# Patient Record
Sex: Male | Born: 1960 | Race: White | Hispanic: No | Marital: Married
Health system: Midwestern US, Community
[De-identification: ages and names within clinical notes are randomized; demographics above are authoritative.]

## PROBLEM LIST (undated history)

## (undated) DIAGNOSIS — K219 Gastro-esophageal reflux disease without esophagitis: Secondary | ICD-10-CM

## (undated) DIAGNOSIS — I10 Essential (primary) hypertension: Secondary | ICD-10-CM

## (undated) DIAGNOSIS — K859 Acute pancreatitis without necrosis or infection, unspecified: Secondary | ICD-10-CM

## (undated) DIAGNOSIS — R739 Hyperglycemia, unspecified: Secondary | ICD-10-CM

## (undated) DIAGNOSIS — F5101 Primary insomnia: Secondary | ICD-10-CM

## (undated) DIAGNOSIS — J302 Other seasonal allergic rhinitis: Secondary | ICD-10-CM

## (undated) DIAGNOSIS — Z79899 Other long term (current) drug therapy: Secondary | ICD-10-CM

## (undated) DIAGNOSIS — F32A Depression, unspecified: Secondary | ICD-10-CM

## (undated) HISTORY — PX: NO PAST SURGERIES: SHX2092

## (undated) MED ORDER — PAROXETINE 20 MG TAB
20 mg | ORAL_TABLET | Freq: Every day | ORAL | Status: DC
Start: ? — End: 2014-03-30

## (undated) MED ORDER — TRAZODONE 50 MG TAB
50 mg | ORAL_TABLET | ORAL | Status: DC
Start: ? — End: 2014-08-16

## (undated) MED ORDER — CITALOPRAM 20 MG TAB
20 mg | ORAL_TABLET | ORAL | Status: DC
Start: ? — End: 2013-09-29

## (undated) MED ORDER — TRAZODONE 50 MG TAB
50 mg | ORAL_TABLET | Freq: Every evening | ORAL | Status: DC
Start: ? — End: 2013-10-04

## (undated) MED ORDER — CELECOXIB 100 MG CAP
100 mg | ORAL_CAPSULE | Freq: Two times a day (BID) | ORAL | Status: DC
Start: ? — End: 2013-10-03

## (undated) MED ORDER — CITALOPRAM 20 MG TAB
20 mg | ORAL_TABLET | Freq: Every day | ORAL | Status: DC
Start: ? — End: 2013-07-28

## (undated) MED ORDER — CYCLOBENZAPRINE 10 MG TAB
10 mg | ORAL_TABLET | Freq: Three times a day (TID) | ORAL | Status: DC | PRN
Start: ? — End: 2013-09-29

## (undated) MED ORDER — IBUPROFEN 800 MG TAB
800 mg | ORAL_TABLET | Freq: Four times a day (QID) | ORAL | Status: AC | PRN
Start: ? — End: 2012-07-20

## (undated) MED ORDER — HYDROCODONE-ACETAMINOPHEN 7.5 MG-325 MG TAB
ORAL_TABLET | Freq: Four times a day (QID) | ORAL | Status: DC | PRN
Start: ? — End: 2012-07-26

---

## 2012-05-10 NOTE — Progress Notes (Signed)
Ambulatory/Rehab Services H2 Model Falls Risk Assessment    Risk Factor Pts. ??   Confusion/Disorientation/Impulsivity  []    4 ??   Symptomatic Depression  []   2 ??   Altered Elimination  []   1 ??   Dizziness/Vertigo  []   1 ??   Gender (Male)  [x]   1 ??   Any administered antiepileptics (anticonvulsants):  []   2 ??   Any administered benzodiazepines:  []   1 ??   Visual Impairment (specify):  []   1 ??   Portable Oxygen Use  []   1 ??   Orthostatic ? BP  []   1 ??   History of Recent Falls (within 3 mos.)  []   5     Ability to Rise from Chair (choose one) Pts. ??   Ability to rise in a single movement  []   0 ??   Pushes up, successful in one attempt  []   1 ??   Multiple attempts, but successful  []   3 ??   Unable to rise without assistance  []   4   Total: (5 or greater = High Risk) 1     Falls Prevention Plan:   []                Physical Limitations to Exercise (specify):   []                Mobility Assistance Device (type):   []                Exercise/Equipment Adaptation (specify):    ??2010 [employer] All Rights Reserved. [place] Patent #7,[id number]. Federal Law prohibits the replication, distribution or use without written permission from [employer]

## 2012-05-10 NOTE — Progress Notes (Signed)
[facility] at [facility]   [address] [place], [place] [address]  Phone:[phone]   Fax:[phone]  Outpatient PHYSICAL THERAPY: Initial Assessment and Discharge  Fall Risk Score: 1 (? 5 = High Risk)  Treatment Diagnosis: Lumbago  REFERRING PHYSICIAN: File, Not On, MD  MD Orders: Evaluate and Treat  Return Physician Appointment: PRN  MEDICAL/REFERRING DIAGNOSIS: Low back pain [724.2]  Other physical therapy [V57.1]  DATE OF ONSET: 04/2012   PRIOR LEVEL OF FUNCTION: Independent  PRECAUTIONS/ALLERGIES: Per Chart  ASSESSMENT:  ????????This section established at most recent assessment??????????  PROBLEM LIST (Impairments causing functional limitations):  1. Decreased Strength affecting function  2. Decreased ADL/Functional Activities  3. Decreased Flexibility/joint mobility  4. Increased Pain affecting function  GOALS: (Goals have been discussed and agreed upon with patient.)  SHORT-TERM FUNCTIONAL GOALS: Time Frame: 1 visit  1. Instruct in a home exercise program.   GOAL ACHIEVED.  DISCHARGE GOALS: Time Frame: 1 visit  1.  Instruct in a home exercise program.  GOAL ACHIEVED.  REHABILITATION POTENTIAL FOR STATED GOALS: GoodPLAN OF CARE:  INTERVENTIONS PLANNED: (Benefits and precautions of physical therapy have been discussed with the patient.)  1. home exercise program (HEP  2. therapeutic exercise/strengthening/ROM exercises  TREATMENT PLAN EFFECTIVE DATES: 05/10/2012 TO 06/10/2012  FREQUENCY/DURATION: Follow patient 1 time a week for 1 week to address above goals.  Regarding [facility] therapy, I certify that the treatment plan above will be carried out by a therapist or under their direction.  Thank you for this referral,  Lilla Shook, PT         Referring Physician Signature: File, Not On, MD          Date                                                   SUBJECTIVE:  History of Present Injury/Illness (Reason for Referral): The patient reported a history of chronic  low back pain and DDD. He underwent a lumbar disectomy in 2009 and recent has been experiencing increased lumbar pain with prolonged flrxed postures. He realizes the need for a core strengthening program and was referred to physical therapy.  Present Symptoms: Increased lumbar pain, spasms.  Pain Intensity 1: 9  Dominant Side: right  Past Medical History: Lumbar disectomy  Current Medications: Per Chart   Date Last Reviewed: 05/10/2012  Social History/Home Situation: Independent  Work/Activity History: Medical products.  OBJECTIVE:  Outcome Measure:   Tool Used: Modified Oswestry Low Back Pain Questionnaire  Score:  Initial: 13/50  Most Recent: X/50 (Date: -- )   Interpretation of Score: Each section is scored on a 0-5 scale, 5 representing the greatest disability.  The scores of each section are added together for a total score of 50.    Score 0 1-10 11-20 21-30 31-40 41-49 50   Modifier CH CI CJ CK CL CM CN       Changing and Maintaining Body Position:    Z3086 - CURRENT STATUS: CJ - 20%-39% impaired, limited or restricted   V7846 - GOAL STATUS:  CJ - 20%-39% impaired, limited or restricted   N6295 - D/C STATUS:  ---------------To be determined---------------    Observation/Orthostatic Postural Assessment:   WNL.  Palpation:  Tenderness with palpation to the patient's lumbar spine.  ROM: TRUNK  AROM   Moderate limits in all trunk motions.                       Strength: LUMBAR SPINE   +3/5 for lumbar strength.              Special Tests: N/A  Neurological Screen: N/A  Functional Mobility: Independent   Balance:  Good.  TREATMENT:    (In addition to Assessment/Re-Assessment sessions the following treatments were rendered)  Therapeutic Exercise: ( 30):  The patient received treatment as below to improve mobility, strength and balance.  Required moderate verbal and manual cues to promote proper body alignment, promote proper body posture and promote proper body mechanics.  Progressed resistance, range and repetitions  as indicated.  The patient received exercise instruction followed by patient demonstration of the exercises to include single leg bridging, trunk rotations, abdominals in supine to include single hip and knee extension, and posterior pelvic tilts. Also trunk flexibility exercises to stabilize the lumbar spine and improved AROM to decrease pain and spasms allowing improved function.  Evaluation: ( 15 )  Manual Therapy (     ):   Therapeutic Modalities:                                                                                               HEP: As above; handouts given to patient for all exercises.  ______________________________________________________________________________________________________    Treatment Assessment:  The patient tolerated today's treatment without symptom exacerbation.  The patient demonstrated independence with the initial home exercises and is to perform the exercises independently and was DC from PT.  Recommendations/Intent for next treatment session: DC to an independent exercise program.  Total Treatment Duration:  PT Patient Time In/Time Out  Time In: 1015  Time Out: 1100    Lilla Shook, PT

## 2012-06-07 NOTE — Progress Notes (Signed)
[facility]rapy Center at [facility]   [address] [place], [place] [address]  [phone]   Fax:[phone]  Outpatient PHYSICAL THERAPY: Initial Assessment and Discharge  Fall Risk Score: 1 (? 5 = High Risk)  Treatment Diagnosis: Lumbago  REFERRING PHYSICIAN: File, Not On, MD  MD Orders: Evaluate and Treat  Return Physician Appointment: PRN  MEDICAL/REFERRING DIAGNOSIS: Low back pain [724.2]  Other physical therapy [V57.1]  DATE OF ONSET: 04/2012   PRIOR LEVEL OF FUNCTION: Independent  PRECAUTIONS/ALLERGIES: Per Chart  ASSESSMENT:  ????????This section established at most recent assessment??????????  PROBLEM LIST (Impairments causing functional limitations):  1. Decreased Strength affecting function  2. Decreased ADL/Functional Activities  3. Decreased Flexibility/joint mobility  4. Increased Pain affecting function  GOALS: (Goals have been discussed and agreed upon with patient.)  SHORT-TERM FUNCTIONAL GOALS: Time Frame: 1 visit  1. Instruct in a home exercise program and use of a neuromuscular stimulator.   GOAL ACHIEVED.  DISCHARGE GOALS: Time Frame: 1 visit  1.  Instruct in a home exercise program and use of a neuromuscular stimulator.  GOAL ACHIEVED.  REHABILITATION POTENTIAL FOR STATED GOALS: GoodPLAN OF CARE:  INTERVENTIONS PLANNED: (Benefits and precautions of physical therapy have been discussed with the patient.)  1. home exercise program (HEP  2. therapeutic exercise/strengthening/ROM exercises  TREATMENT PLAN EFFECTIVE DATES: 05/10/2012 TO 06/10/2012  FREQUENCY/DURATION: Follow patient 1 time a week for 1 week to address above goals.  Regarding [facility] therapy, I certify that the treatment plan above will be carried out by a therapist or under their direction.  Thank you for this referral,  Lilla Shook, PT         Referring Physician Signature: File, Not On, MD          Date                                                       SUBJECTIVE:  History of Present  Injury/Illness (Reason for Referral): The patient reported a history of chronic low back pain and DDD. He underwent a lumbar disectomy in 2009 and recent has been experiencing increased lumbar pain with prolonged flrxed postures. He realizes the need for a core strengthening program and was referred to physical therapy.  Present Symptoms: Increased lumbar pain, spasms.    The patient returned for portable neuromuscular stimulator as a result of muscle weakness and atrophy decreasing his lumbar stability and leading to increased his pain levels.  Pain Intensity 1: 9  Dominant Side: right  Past Medical History: Lumbar disectomy  Current Medications: Per Chart   Date Last Reviewed: 06/07/2012  Social History/Home Situation: Independent  Work/Activity History: Medical products.  OBJECTIVE:  Outcome Measure:   Tool Used: Modified Oswestry Low Back Pain Questionnaire  Score:  Initial: 13/50  Most Recent: X/50 (Date: -- )   Interpretation of Score: Each section is scored on a 0-5 scale, 5 representing the greatest disability.  The scores of each section are added together for a total score of 50.    Score 0 1-10 11-20 21-30 31-40 41-49 50   Modifier CH CI CJ CK CL CM CN       Changing and Maintaining Body Position:    M8413 - CURRENT STATUS: CJ - 20%-39% impaired, limited or restricted  N0272 - GOAL STATUS:  CJ - 20%-39% impaired, limited or restricted   Z3664 - D/C STATUS:  CJ - 20%-39% impaired, limited or restricted    Observation/Orthostatic Postural Assessment:   WNL.  Palpation:  Tenderness with palpation to the patient's lumbar spine.  ROM: TRUNK AROM   Moderate limits in all trunk motions.                       Strength: LUMBAR SPINE   +3/5 for lumbar strength.              Special Tests: N/A  Neurological Screen: N/A  Functional Mobility: Independent   Balance:  Good.  TREATMENT:    (In addition to Assessment/Re-Assessment sessions the following treatments were rendered)  Therapeutic Exercise: ( 15):  The patient  received treatment as below to improve mobility, strength and balance.  Required moderate verbal and manual cues to promote proper body alignment, promote proper body posture and promote proper body mechanics.  Progressed resistance, range and repetitions as indicated.  The patient received exercise instruction followed by patient demonstration of the exercises to include single leg bridging, trunk rotations, abdominals in supine to include single hip and knee extension, and posterior pelvic tilts. Also trunk flexibility exercises to stabilize the lumbar spine and improved AROM to decrease pain and spasms allowing improved function.   The patient was instructed in the use of his neuromuscular stimulator to use as a result of decreased strength and muscle atrophy effecting his lumbar spine.  Evaluation: (  )  Manual Therapy (     ):   Therapeutic Modalities:                                                                                               HEP: As above; handouts given to patient for all exercises.  ______________________________________________________________________________________________________    Treatment Assessment:  The patient tolerated today's treatment without symptom exacerbation.  The patient demonstrated independence with the initial home exercises and is to perform the exercises independently along with use of a neuromuscular stimulator and was DC from PT.  Recommendations/Intent for next treatment session: DC to an independent exercise program.  Total Treatment Duration:  PT Patient Time In/Time Out  Time In: 1000  Time Out: 1015    Lilla Shook, PT
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
## 2012-07-13 NOTE — ED Notes (Signed)
Manual signout performed
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
## 2012-07-13 NOTE — ED Notes (Signed)
I have reviewed discharge instructions with the patient.  The patient verbalized understanding.
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
## 2012-07-27 ENCOUNTER — Telehealth

## 2012-07-27 NOTE — Telephone Encounter (Signed)
ok 

## 2012-07-28 LAB — AMB POC TUBERCULOSIS, INTRADERMAL (SKIN TEST)

## 2012-07-28 NOTE — Progress Notes (Signed)
Ppd reading is negative.-N.S. ADAMS,CMA

## 2012-07-30 LAB — METABOLIC PANEL, BASIC
BUN: 12 mg/dl (ref 9–23)
CO2: 25 mmol/L (ref 20–32)
Calcium: 9.2 mg/dl (ref 8.4–10.5)
Chloride: 102 mmol/L (ref 97–111)
Creatinine: 1.1 mg/dL (ref 0.7–1.5)
GFR est AA: 88.7 mL/min (ref >60.00)
GFR est non-AA: 73.19 mL/min (ref >60.00)
Glucose: 89 mg/dl (ref 65–125)
Potassium: 4.1 mmol/L (ref 3.5–5.5)
Sodium: 137 mmol/L (ref 135–148)

## 2012-07-30 LAB — LIPID PANEL
CHOL/HDL Ratio: 3.8 (ref 0.0–6.7)
Cholesterol, total: 193 mg/dl (ref 0–200)
HDL Cholesterol: 50 mg/dl (ref >40)
LDL, calculated: 114 mg/dL (ref 0–130)
Triglyceride: 145 mg/dl (ref 0–200)
VLDL, calculated: 29 mg/dL (ref 0–40)

## 2012-07-30 LAB — CBC WITH AUTOMATED DIFF
ABS. GRANULOCYTES: 4.6 10*3/uL (ref 2.0–7.8)
ABS. LYMPHOCYTES: 1.9 10*3/uL — ABNORMAL LOW (ref 2.0–7.8)
ABS. MONOCYTES: 0.4 (ref 0.10–1.08)
GRANULOCYTES: 66 % (ref 37.0–92.0)
HCT: 42.2 % (ref 41.0–54.0)
HGB: 14.3 g/dL (ref 14.0–18.0)
LYMPHOCYTES: 27.6 % (ref 20.5–51.1)
MCH: 31.2 pg (ref 27.0–34.0)
MCHC: 34 g/dL (ref 31.0–36.0)
MCV: 92 fL (ref 80.0–100.0)
MEAN PLATELET VOLUME: 8.5 fL
MONOCYTES: 6.4 (ref 3.0–10.0)
PLATELET: 212 10*3/uL (ref 140–440)
RBC: 4.59 10*3/uL (ref 4.40–6.20)
RDW: 13 %
WBC: 6.9 10*3/uL (ref 4.0–11.0)

## 2012-08-02 LAB — PSA, DIAGNOSTIC (PROSTATE SPECIFIC AG): PSA: 0.4 ng/ml (ref 0.0–4.0)

## 2012-08-02 NOTE — Telephone Encounter (Signed)
Labs looked good.  Cholesterol was up just a touch, but not enough to need medicine.  He needs a copy of our diet handout.

## 2012-08-04 NOTE — Telephone Encounter (Signed)
08/04/2012  Labs looked good. Cholesterol was up just a touch, but not enough to need medicine. He needs a copy of our diet handout.-N.S. ADAMS,CMA

## 2013-03-22 LAB — CBC WITH AUTOMATED DIFF
ABS. GRANULOCYTES: 4.2 10*3/uL (ref 2.0–7.8)
ABS. LYMPHOCYTES: 1.7 10*3/uL — ABNORMAL LOW (ref 2.0–7.8)
ABS. MONOCYTES: 0.5 (ref 0.10–1.08)
GRANULOCYTES: 65.1 % (ref 37.0–92.0)
HCT: 39.7 % — ABNORMAL LOW (ref 41.0–54.0)
HGB: 14 g/dL (ref 14.0–18.0)
LYMPHOCYTES: 26.5 % (ref 20.5–51.1)
MCH: 33.1 pg (ref 27.0–34.0)
MCHC: 35.3 g/dL (ref 31.0–36.0)
MCV: 93.8 fL (ref 80.0–100.0)
MEAN PLATELET VOLUME: 8 fL
MONOCYTES: 8.4 (ref 3.0–10.0)
PLATELET: 199 10*3/uL (ref 140–440)
RBC: 4.23 10*3/uL — ABNORMAL LOW (ref 4.40–6.20)
RDW: 12.7 %
WBC: 6.4 10*3/uL (ref 4.0–11.0)

## 2013-03-22 LAB — HEPATIC FUNCTION PANEL
ALT (SGPT): 22 IU/L (ref 10–35)
AST (SGOT): 29 IU/L (ref 16–40)
Albumin: 4.2 g/dl (ref 3.2–5.0)
Alk. phosphatase: 69 U/L (ref 31–130)
Bilirubin, direct: 0.1 mg/dl (ref 0.0–0.4)
Bilirubin, total: 0.7 mg/dl (ref 0.4–1.4)
Protein, total: 6.8 g/dl (ref 6.0–8.5)

## 2013-03-22 LAB — LIPID PANEL
CHOL/HDL Ratio: 3.7 (ref 0.0–6.7)
Cholesterol, total: 201 mg/dl — ABNORMAL HIGH (ref 0–200)
HDL Cholesterol: 55 mg/dl (ref >40)
LDL, calculated: 136 mg/dL — ABNORMAL HIGH (ref 0–130)
Triglyceride: 51 mg/dl (ref 0–200)
VLDL, calculated: 10 mg/dL (ref 0–40)

## 2013-03-22 LAB — METABOLIC PANEL, BASIC
BUN: 14 mg/dl (ref 9–23)
CO2: 29 mmol/L (ref 20–32)
Calcium: 9.4 mg/dl (ref 8.4–10.5)
Chloride: 101 mmol/L (ref 97–111)
Creatinine: 1 mg/dL (ref 0.7–1.5)
GFR est AA: 100.84 mL/min (ref >60.00)
GFR est non-AA: 83.2 mL/min (ref >60.00)
Glucose: 89 mg/dl (ref 65–100)
Potassium: 4.3 mmol/L (ref 3.5–5.5)
Sodium: 138 mmol/L (ref 135–148)

## 2013-03-22 NOTE — Progress Notes (Addendum)
Dustin Noble, M.D.  Internal Medicine  [facility]  [address] [place], [place] [address]  Phone: [phone] Fax: [phone]    HISTORY OF PRESENT ILLNESS  Dustin Noble is a 52 y.o. male.  HPI    Dustin Noble is a Caucasian male who first came to me 07/2012.    1. HLD (272.4): Controlled with TLCs.   a. Context:   i. ATP Risk Factors =   ii. Framingham =   iii. Goal LDL =   b. FLPs:   i. 07/30/12 Untreated = [193/114/50/145].   ii. 03/22/13 Untreated = [201/136/55/51].   2. Chronic Low Back Pain (724.2): Followed with Pain Management in [place].  Now follows with Ortho down here.   3. Anxiety (300.00): 07/26/12 started on Celexa which he's taking without problems and with good control of symptoms.   4. Insomnia (780.52): 03/22/13 started on Trazodone.   5. HCM:     Colonoscopy: 09/13/08 in [place] = Tics; No polyps; 7-10 Year Repeat Rec.    Prostate:   o PSA's:  1. 07/30/12 = 0.4.    Pneumovax:     Td:     Flu: 07/26/12.    Current Outpatient Prescriptions   Medication Sig Dispense Refill   ??? tiZANidine (ZANAFLEX) 4 mg tablet Take 4 mg by mouth two (2) times daily as needed.       ??? ibuprofen (MOTRIN) 800 mg tablet Take  by mouth every six (6) hours as needed for Pain.       ??? citalopram (CELEXA) 20 mg tablet Take 1 Tab by mouth daily.  90 Tab  1   ??? cyclobenzaprine (FLEXERIL) 10 mg tablet Take 1 Tab by mouth three (3) times daily as needed for Muscle Spasm(s).  20 Tab  0     No Known Allergies  Past Medical History   Diagnosis Date   ??? Chronic low back pain    ??? Anxiety      History reviewed. No pertinent past surgical history.  History     Social History   ??? Marital Status: MARRIED     Spouse Name: N/A     Number of Children: N/A   ??? Years of Education: N/A     Occupational History   ??? Sells vascular stents.       Social History Main Topics   ??? Smoking status: Never Smoker    ??? Smokeless tobacco: Not on file   ??? Alcohol Use: No   ??? Drug Use: No   ??? Sexually Active: Not on file     Other Topics Concern   ??? Not  on file     Social History Narrative   ??? No narrative on file     Family History   Problem Relation Age of Onset   ??? Diabetes Brother      Dm1   ??? Cancer Neg Hx    ??? Heart Disease Neg Hx    ??? Heart Attack Neg Hx      Review of Systems   Constitutional: Negative for fever, chills and weight loss.   Respiratory: Negative for cough, shortness of breath and wheezing.    Cardiovascular: Negative for chest pain, palpitations and leg swelling.   Gastrointestinal: Negative for nausea, vomiting, abdominal pain, diarrhea, constipation, blood in stool and melena.   Neurological: Negative for dizziness, sensory change, focal weakness, seizures and headaches.     Physical Exam   Nursing note and vitals reviewed.  Constitutional:  He is oriented to person, place, and time. He appears well-developed and well-nourished. No distress.   HENT:   Head: Normocephalic and atraumatic.   Neck: Normal range of motion. Neck supple. No JVD present. No tracheal deviation present. No thyromegaly present.   Cardiovascular: Normal rate, regular rhythm, normal heart sounds and intact distal pulses.  Exam reveals no gallop and no friction rub.    No murmur heard.  Pulmonary/Chest: Effort normal and breath sounds normal. No stridor. No respiratory distress. He has no wheezes. He has no rales. He exhibits no tenderness.   Abdominal: Soft. Bowel sounds are normal. He exhibits no distension and no mass. There is no tenderness. There is no rebound and no guarding.   Musculoskeletal: He exhibits no edema.   Lymphadenopathy:     He has no cervical adenopathy.   Neurological: He is alert and oriented to person, place, and time.   Skin: Skin is warm and dry. No rash noted. He is not diaphoretic. No erythema. No pallor.   Psychiatric: He has a normal mood and affect. His behavior is normal. Judgment and thought content normal.     ASSESSMENT and PLAN    1. HLD (272.4): Well controlled off medications.  Continue TLCs.   2. Chronic Low Back Pain (724.2): Follow  up with Ortho.  3. Anxiety (300.00): Well controlled.  Continue current regimen.   4. Insomnia (780.52): Start Trazodone.  5. HCM:   6. F/u: 6 Months.  No planned labs at that visit.    [name] was seen today for anxiety.    Diagnoses and associated orders for this visit:    HLD (hyperlipidemia)  - BASIC METABOLIC PANEL (46962)  - CBC (95284)  - LIPID PANEL (13244)  - LIVER PANEL 1 (01027)    Anxiety    Chronic low back pain    Insomnia  - traZODone (DESYREL) 50 mg tablet; Take 1 Tab by mouth nightly.    Encounter for long-term (current) use of other medications  - BASIC METABOLIC PANEL (25366)  - CBC (44034)  - LIVER PANEL 1 (74259)

## 2013-09-29 NOTE — Progress Notes (Signed)
Dustin MillardJohn E. Anuel Noble, M.D.  Internal Medicine  [facility]  [address] [place], [place] [address]  Phone: [phone] Fax: [phone]    HISTORY OF PRESENT ILLNESS  Dustin BunchChristopher A Noble is a 53 y.o. male.  HPI    Dustin Noble is a Caucasian male who first came to me 07/2012.     1. HLD (272.4): Controlled with TLCs.   a. Context:   i. ATP Risk Factors =   ii. Framingham =   iii. Goal LDL =   b. FLPs:   i. 07/30/12 Untreated = [193/114/50/145].   ii. 03/22/13 Untreated = [201/136/55/51].   2. Chronic Low Back Pain (724.2): Followed with Pain Management in [place].  Now follows with Charletta CousinSteadman Hawkins down here.   3. Anxiety (300.00): 07/26/12 started on Celexa which he's taking without problems and without good control of symptoms.  09/29/13 started on Paxil.   4. Insomnia (780.52): 03/22/13 started on Trazodone which he's taking without problems and with good control of symptoms.   5. HCM:     Colonoscopy: 09/13/08 in [place] = Tics; No polyps; 7-10 Year Repeat Rec.    Prostate:   o PSA's:  1. 07/30/12 = 0.4.    Pneumovax:     Td:     Flu: Elsewhere 2014.    Current Outpatient Prescriptions   Medication Sig Dispense Refill   ??? oxyCODONE IR (ROXICODONE) 5 mg immediate release tablet Take 5 mg by mouth every four (4) hours as needed for Pain.       ??? citalopram (CELEXA) 20 mg tablet TAKE 1 TABLET BY MOUTH EVERY DAY  90 tablet  1   ??? traZODone (DESYREL) 50 mg tablet Take 1 Tab by mouth nightly.  90 Tab  1     No Known Allergies  Past Medical History   Diagnosis Date   ??? Chronic low back pain    ??? Anxiety      History reviewed. No pertinent past surgical history.  History     Social History   ??? Marital Status: MARRIED     Spouse Name: N/A     Number of Children: N/A   ??? Years of Education: N/A     Occupational History   ??? Sells vascular stents.       Social History Main Topics   ??? Smoking status: Never Smoker    ??? Smokeless tobacco: Not on file   ??? Alcohol Use: No   ??? Drug Use: No   ??? Sexually Active: Not on file     Other Topics  Concern   ??? Not on file     Social History Narrative   ??? No narrative on file     Family History   Problem Relation Age of Onset   ??? Diabetes Brother      Dm1   ??? Cancer Neg Hx    ??? Heart Disease Neg Hx    ??? Heart Attack Neg Hx      Review of Systems   Constitutional: Negative for fever, chills and weight loss.   Respiratory: Negative for cough, shortness of breath and wheezing.    Cardiovascular: Negative for chest pain, palpitations and leg swelling.   Gastrointestinal: Negative for nausea, vomiting, abdominal pain, diarrhea, constipation, blood in stool and melena.   Neurological: Negative for dizziness, sensory change, focal weakness, seizures and headaches.   Psychiatric/Behavioral: Negative for depression, suicidal ideas, hallucinations and substance abuse. The patient is nervous/anxious.      Physical  Exam   Nursing note and vitals reviewed.  Constitutional: He is oriented to person, place, and time. He appears well-developed and well-nourished. No distress.   HENT:   Head: Normocephalic and atraumatic.   Neck: Normal range of motion. Neck supple. No JVD present. No tracheal deviation present. No thyromegaly present.   Cardiovascular: Normal rate, regular rhythm, normal heart sounds and intact distal pulses.  Exam reveals no gallop and no friction rub.    No murmur heard.  Pulmonary/Chest: Effort normal and breath sounds normal. No stridor. No respiratory distress. He has no wheezes. He has no rales. He exhibits no tenderness.   Abdominal: Soft. Bowel sounds are normal. He exhibits no distension and no mass. There is no tenderness. There is no rebound and no guarding.   Musculoskeletal: He exhibits no edema.   Lymphadenopathy:     He has no cervical adenopathy.   Neurological: He is alert and oriented to person, place, and time.   Skin: Skin is warm and dry. No rash noted. He is not diaphoretic. No erythema. No pallor.   Psychiatric: He has a normal mood and affect. His behavior is normal. Judgment and  thought content normal.     ASSESSMENT and PLAN    1. HLD (272.4): Well controlled off medications.  Continue TLCs.   2. Chronic Low Back Pain (724.2): Follow up with Ortho.  3. Anxiety (300.00): Not well controlled.  Switch to Paxil.   4. Insomnia (780.52): Well controlled.  Continue current regimen.   5. HCM:   6. F/u: 6 Months.  Fasting labs at that visit.    Dustin Noble was seen today for anxiety.    Diagnoses and associated orders for this visit:    HLD (hyperlipidemia)    Chronic low back pain    Insomnia    Anxiety  - PARoxetine (PAXIL) 20 mg tablet; Take 1 tablet by mouth daily.    Other Orders  - oxyCODONE IR (ROXICODONE) 5 mg immediate release tablet; Take 5 mg by mouth every four (4) hours as needed for Pain.

## 2013-10-03 NOTE — Telephone Encounter (Signed)
Legally I can't give him narcotics for a problem that I'm not treating him for.  I could send him in some anti-inflammatories to tide him over until his pain management doctor is back in.

## 2013-10-03 NOTE — Telephone Encounter (Signed)
Pt notified that Legally Dr. Westly PamLacy can't give him narcotics for a problem that he is not treating him for. Dr. Westly PamLacy could send him in some anti-inflammatories to tide him over until his pain management doctor is back in. Pt declined and would contact the doctor tomorrow about his medication. celebrex was not called to the pharmacy

## 2013-10-03 NOTE — Telephone Encounter (Signed)
Pt was given pain medication from his orthopaedic, in which his doctor is closed for the holiday. Pt was told by his ortho doctor on call, to contact you and ask if you will fill this medication. Pt was explained that we can not refill controlled substance that was ordered from another doctor.

## 2014-03-30 LAB — CBC WITH AUTOMATED DIFF
ABS. GRANULOCYTES: 4.1 10*3/uL (ref 2.0–7.8)
ABS. LYMPHOCYTES: 2.4 10*3/uL (ref 2.0–7.8)
ABS. MONOCYTES: 0.5 (ref 0.10–1.08)
GRANULOCYTES: 59.1 % (ref 37.0–92.0)
HCT: 43 % (ref 41.0–54.0)
HGB: 14.4 g/dL (ref 14.0–18.0)
LYMPHOCYTES: 34.4 % (ref 20.5–51.1)
MCH: 31.2 pg (ref 27.0–34.0)
MCHC: 33.6 g/dL (ref 31.0–36.0)
MCV: 92.8 fL (ref 80.0–100.0)
MEAN PLATELET VOLUME: 9.9 fL
MONOCYTES: 6.5 (ref 3.0–10.0)
PLATELET: 201 10*3/uL (ref 140–440)
RBC: 4.63 10*3/uL (ref 4.40–6.20)
RDW: 13 %
WBC: 7 10*3/uL (ref 4.0–11.0)

## 2014-03-30 LAB — HEPATIC FUNCTION PANEL
ALT (SGPT): 26 IU/L (ref 10–35)
AST (SGOT): 40 IU/L (ref 16–40)
Albumin: 4.4 g/dl (ref 3.2–5.0)
Alk. phosphatase: 65 U/L (ref 31–130)
Bilirubin, direct: 0.1 mg/dl (ref 0.0–0.4)
Bilirubin, total: 0.9 mg/dl (ref 0.4–1.4)
Protein, total: 6.7 g/dl (ref 6.0–8.5)

## 2014-03-30 LAB — METABOLIC PANEL, BASIC
BUN: 20 mg/dl (ref 9–23)
CO2: 27 mmol/L (ref 20–32)
Calcium: 9.5 mg/dl (ref 8.4–10.5)
Chloride: 99 mmol/L (ref 97–111)
Creatinine: 1 mg/dL (ref 0.7–1.5)
GFR est AA: 106.58 mL/min (ref >60.00)
GFR est non-AA: 87.93 mL/min (ref >60.00)
Glucose: 76 mg/dl (ref 65–100)
Potassium: 4.2 mmol/L (ref 3.5–5.5)
Sodium: 136 mmol/L (ref 135–148)

## 2014-03-30 LAB — LIPID PANEL
CHOL/HDL Ratio: 3.7 (ref 0.0–6.7)
Cholesterol, total: 181 mg/dl (ref 0–200)
HDL Cholesterol: 49 mg/dl (ref >40)
LDL, calculated: 122 mg/dL (ref 0–130)
Triglyceride: 50 mg/dl (ref 0–200)
VLDL, calculated: 10 mg/dL (ref 0–40)

## 2014-03-30 LAB — PSA, DIAGNOSTIC (PROSTATE SPECIFIC AG): PSA: 0.3 ng/ml (ref 0.0–4.0)

## 2014-03-30 NOTE — Progress Notes (Addendum)
Lona MillardJohn E. Jamin Humphries, M.D.  Internal Medicine  [facility]  [address] [place], [place] [address]  Phone: [phone] Fax: [phone]    HISTORY OF PRESENT ILLNESS  Eudelia BunchChristopher A Hillmer is a 53 y.o. male.  HPI    [name] is a Caucasian male who first came to me 07/2012.     1. HLD (272.4): Controlled with TLCs.   a. Context:   i. ATP Risk Factors =   ii. Framingham =   iii. Goal LDL =   b. FLPs:   i. 07/30/12 Untreated = [193/114/50/145].   ii. 03/22/13 Untreated = [201/136/55/51].   iii. 03/30/14 Untreated = [181/122/49/50].   2. Insomnia (780.52): 03/22/13 started on Trazodone which he's taking without problems and with good control of symptoms.   3. Chronic Back Pain (724.5): Followed with Pain Management in [place].  Now follows with Charletta CousinSteadman Hawkins.   4. HCM:   ? Colonoscopy: 09/13/08 in [place] = Tics; No polyps; 7-10 Year Repeat Rec.  ? Prostate:   o PSA's:  1. 07/30/12 = 0.4.  2. 03/30/14 = 0.3.   ? Pneumovax:   ? Td:   ? Flu: Elsewhere 2014.    Current Outpatient Prescriptions   Medication Sig Dispense Refill   ??? traZODone (DESYREL) 50 mg tablet TAKE 1 TABLET BY MOUTH NIGHTLY 90 tablet 1   ??? oxyCODONE IR (ROXICODONE) 5 mg immediate release tablet Take 5 mg by mouth every four (4) hours as needed for Pain.     ??? PARoxetine (PAXIL) 20 mg tablet Take 1 tablet by mouth daily. 90 tablet 1     No Known Allergies  Past Medical History   Diagnosis Date   ??? Chronic low back pain    ??? Anxiety    ??? Insomnia 09/29/2013     History reviewed. No pertinent past surgical history.  History     Social History   ??? Marital Status: MARRIED     Spouse Name: N/A     Number of Children: N/A   ??? Years of Education: N/A     Occupational History   ??? Sells vascular stents.       Social History Main Topics   ??? Smoking status: Never Smoker    ??? Smokeless tobacco: Not on file   ??? Alcohol Use: No   ??? Drug Use: No   ??? Sexual Activity: Not on file     Other Topics Concern   ??? Not on file     Social History Narrative     Family History    Problem Relation Age of Onset   ??? Diabetes Brother      Dm1   ??? Cancer Neg Hx    ??? Heart Disease Neg Hx    ??? Heart Attack Neg Hx      Review of Systems   Respiratory: Negative for cough, sputum production, shortness of breath and wheezing.    Cardiovascular: Negative for chest pain, palpitations, claudication and leg swelling.   Gastrointestinal: Negative for nausea, vomiting, abdominal pain, blood in stool and melena.     Physical Exam   Constitutional: He is oriented to person, place, and time. He appears well-developed and well-nourished. No distress.   HENT:   Head: Normocephalic and atraumatic.   Neck: Normal range of motion. Neck supple. No JVD present. No tracheal deviation present. No thyromegaly present.   Cardiovascular: Normal rate, regular rhythm, normal heart sounds and intact distal pulses.  Exam reveals no gallop and no  friction rub.    No murmur heard.  Pulmonary/Chest: Effort normal and breath sounds normal. No stridor. No respiratory distress. He has no wheezes. He has no rales. He exhibits no tenderness.   Abdominal: Soft. Bowel sounds are normal. He exhibits no distension and no mass. There is no tenderness. There is no rebound and no guarding.   Musculoskeletal: He exhibits no edema.   Lymphadenopathy:     He has no cervical adenopathy.   Neurological: He is alert and oriented to person, place, and time.   Skin: Skin is warm and dry. No rash noted. He is not diaphoretic. No erythema. No pallor.   Psychiatric: He has a normal mood and affect. His behavior is normal. Judgment and thought content normal.   Nursing note and vitals reviewed.    ASSESSMENT and PLAN    1. HLD (272.4): Well controlled off medications.  Continue TLCs.   2. Insomnia (780.52): Well controlled.  Continue current regimen.   3. Chronic Low Back Pain (724.5): Follow up with Ortho.  4. HCM:   5. F/u: 6 Months.  No planned labs at that visit.    Dezman was seen today for cholesterol problem.     Diagnoses and associated orders for this visit:    HLD (hyperlipidemia)  - BMP  - CBC  - Lipid Panel  - Liver Panel    Chronic back pain    Insomnia    Anxiety    Prostate cancer screening  - PSA

## 2014-08-17 MED ORDER — TRAZODONE 50 MG TAB
50 mg | ORAL_TABLET | ORAL | Status: DC
Start: 2014-08-17 — End: 2015-04-04

## 2014-08-21 ENCOUNTER — Institutional Professional Consult (permissible substitution)
Admit: 2014-08-21 | Discharge: 2014-08-21 | Payer: BLUE CROSS/BLUE SHIELD | Attending: Internal Medicine | Primary: Internal Medicine

## 2014-08-21 DIAGNOSIS — Z111 Encounter for screening for respiratory tuberculosis: Secondary | ICD-10-CM

## 2014-08-21 NOTE — Progress Notes (Signed)
PPD placed in Left forearm

## 2014-08-23 ENCOUNTER — Encounter: Attending: Internal Medicine | Primary: Internal Medicine

## 2014-08-24 ENCOUNTER — Ambulatory Visit
Admit: 2014-08-24 | Discharge: 2014-08-24 | Payer: BLUE CROSS/BLUE SHIELD | Attending: Internal Medicine | Primary: Internal Medicine

## 2014-08-24 DIAGNOSIS — Z111 Encounter for screening for respiratory tuberculosis: Secondary | ICD-10-CM

## 2014-08-24 LAB — AMB POC TUBERCULOSIS, INTRADERMAL (SKIN TEST)
PPD: 0 Negative
mm Induration: 0 mm

## 2014-08-24 NOTE — Progress Notes (Signed)
PPD reading was negative

## 2014-10-04 ENCOUNTER — Ambulatory Visit
Admit: 2014-10-04 | Discharge: 2014-10-04 | Payer: BLUE CROSS/BLUE SHIELD | Attending: Internal Medicine | Primary: Internal Medicine

## 2014-10-04 DIAGNOSIS — E785 Hyperlipidemia, unspecified: Secondary | ICD-10-CM

## 2014-10-04 NOTE — Progress Notes (Signed)
Dustin MillardJohn E. Adit Noble, M.D.  Internal Medicine  [facility]  [address] [place], [place] [address]  Phone: [phone] Fax: [phone]    HISTORY OF PRESENT ILLNESS  Dustin BunchChristopher A Noble is a 54 y.o. male.  Cholesterol Problem  The history is provided by the patient. This is a chronic problem. The current episode started more than 1 week ago. The problem occurs constantly. The problem has not changed since onset.Pertinent negatives include no chest pain, no abdominal pain and no shortness of breath. The symptoms are aggravated by eating. Relieved by: TLCs. Treatments tried: TLCs. The treatment provided moderate relief.     Dustin Noble is a Caucasian male who first came to me 07/2012.     1. HLD (272.4): Controlled with TLCs.   a. Context:   i. ATP Risk Factors =   ii. Framingham =   iii. Goal LDL =   b. FLPs:   i. 07/30/12 Untreated = [193/114/50/145].   ii. 03/22/13 Untreated = [201/136/55/51].   iii. 03/30/14 Untreated = [181/122/49/50].   2. Insomnia (780.52): 03/22/13 started on Trazodone which he's taking without problems and with good control of symptoms.   3. Chronic Back Pain: On Suboxone from Dr. Diamond NickelSherbondy.   4. HCM:   ? Colonoscopy: 09/13/08 in [place] = Tics; No polyps; 7-10 Year Repeat Rec.  He prefers 10 years, but might consider next year.   ? Prostate:   o PSA's:  1. 07/30/12 = 0.4.  2. 03/30/14 = 0.3.   ? Pneumovax:   ? Td:   ? Flu: Elsewhere 2015.    Current Outpatient Prescriptions   Medication Sig Dispense Refill   ??? traZODone (DESYREL) 50 mg tablet TAKE 1 TABLET BY MOUTH NIGHTLY 90 Tab 1     No Known Allergies  Past Medical History   Diagnosis Date   ??? Chronic low back pain    ??? Anxiety    ??? Insomnia 09/29/2013     No past surgical history on file.  History     Social History   ??? Marital Status: MARRIED     Spouse Name: N/A     Number of Children: N/A   ??? Years of Education: N/A     Occupational History   ??? Sells vascular stents.       Social History Main Topics   ??? Smoking status: Never Smoker     ??? Smokeless tobacco: Not on file   ??? Alcohol Use: No   ??? Drug Use: No   ??? Sexual Activity: Not on file     Other Topics Concern   ??? Not on file     Social History Narrative     Family History   Problem Relation Age of Onset   ??? Diabetes Brother      Dm1   ??? Cancer Neg Hx    ??? Heart Disease Neg Hx    ??? Heart Attack Neg Hx      Review of Systems   Respiratory: Negative for cough, sputum production, shortness of breath and wheezing.    Cardiovascular: Negative for chest pain, palpitations, claudication and leg swelling.   Gastrointestinal: Negative for nausea, vomiting, abdominal pain, blood in stool and melena.     Physical Exam   Constitutional: He is oriented to person, place, and time. He appears well-developed and well-nourished. No distress.   HENT:   Head: Normocephalic and atraumatic.   Neck: Normal range of motion. Neck supple. No JVD present. No tracheal deviation present.  No thyromegaly present.   Cardiovascular: Normal rate, regular rhythm, normal heart sounds and intact distal pulses.  Exam reveals no gallop and no friction rub.    No murmur heard.  Pulmonary/Chest: Effort normal and breath sounds normal. No stridor. No respiratory distress. He has no wheezes. He has no rales. He exhibits no tenderness.   Abdominal: Soft. Bowel sounds are normal. He exhibits no distension and no mass. There is no tenderness. There is no rebound and no guarding.   Musculoskeletal: He exhibits no edema.   Lymphadenopathy:     He has no cervical adenopathy.   Neurological: He is alert and oriented to person, place, and time.   Skin: Skin is warm and dry. No rash noted. He is not diaphoretic. No erythema. No pallor.   Psychiatric: He has a normal mood and affect. His behavior is normal. Judgment and thought content normal.   Nursing note and vitals reviewed.    ASSESSMENT and PLAN    1. HLD (272.4): Well controlled off medications.  Continue TLCs.   2. Insomnia (780.52): Well controlled.  Continue current regimen.    3. Chronic Low Back Pain (724.5): Follow up with Dr. Diamond Nickel.  4. HCM:   5. F/u: 6 Months.  No planned labs at that visit.    [name] was seen today for cholesterol problem.    Diagnoses and all orders for this visit:    HLD (hyperlipidemia)    Insomnia

## 2015-04-04 ENCOUNTER — Ambulatory Visit
Admit: 2015-04-04 | Discharge: 2015-04-04 | Payer: BLUE CROSS/BLUE SHIELD | Attending: Internal Medicine | Primary: Internal Medicine

## 2015-04-04 DIAGNOSIS — E782 Mixed hyperlipidemia: Secondary | ICD-10-CM

## 2015-04-04 LAB — CBC WITH AUTOMATED DIFF
ABS. GRANULOCYTES: 4.1 10*3/uL (ref 2.0–7.8)
ABS. LYMPHOCYTES: 2.1 10*3/uL (ref 2.0–7.8)
ABS. MONOCYTES: 0.3 10*3/uL (ref 0.10–1.08)
GRANULOCYTES: 63.3 % (ref 37.0–92.0)
HCT: 42 % (ref 41.0–54.0)
HGB: 14.1 g/dL (ref 14.0–18.0)
LYMPHOCYTES: 32 % (ref 20.5–51.1)
MCH: 29.8 pg (ref 27.0–34.0)
MCHC: 33.7 g/dL (ref 31.0–36.0)
MCV: 88.6 fL (ref 80.0–100.0)
MEAN PLATELET VOLUME: 9.2 fL
MONOCYTES: 4.7 % (ref 3.0–10.0)
PLATELET: 179 10*3/uL (ref 140–440)
RBC: 4.74 M/uL (ref 4.40–6.20)
RDW: 13 %
WBC: 6.5 10*3/uL (ref 4.0–11.0)

## 2015-04-04 LAB — METABOLIC PANEL, BASIC
BUN: 18 mg/dl (ref 9–23)
CO2: 29 mmol/L (ref 20–32)
Calcium: 9.3 mg/dl (ref 8.4–10.5)
Chloride: 102 mmol/L (ref 97–111)
Creatinine: 1 mg/dL (ref 0.7–1.5)
GFR est AA: 101.24 mL/min (ref >60.00)
GFR est non-AA: 83.53 mL/min (ref >60.00)
Glucose: 84 mg/dl (ref 65–100)
Potassium: 4.3 mmol/L (ref 3.5–5.5)
Sodium: 139 mmol/L (ref 135–148)

## 2015-04-04 LAB — LIPID PANEL
CHOL/HDL Ratio: 3.7 (ref 0.0–6.7)
Cholesterol, total: 179 mg/dl (ref 0–200)
HDL Cholesterol: 48 mg/dl (ref >40)
LDL, calculated: 120 mg/dL (ref 0–130)
Triglyceride: 56 mg/dl (ref 0–200)
VLDL, calculated: 11 mg/dL (ref 0–40)

## 2015-04-04 LAB — PSA, DIAGNOSTIC (PROSTATE SPECIFIC AG): PSA: 0.3 ng/ml (ref 0.0–4.0)

## 2015-04-04 MED ORDER — TRAZODONE 50 MG TAB
50 mg | ORAL_TABLET | ORAL | Status: DC
Start: 2015-04-04 — End: 2015-07-18

## 2015-04-04 NOTE — Progress Notes (Addendum)
Dustin MillardJohn E. Petra Noble, M.D.  Internal Medicine  [facility]  [address] [place], [place] [address]  Phone: [phone] Fax: [phone]    HISTORY OF PRESENT ILLNESS  Dustin BunchChristopher A Noble is a 54 y.o. male.  Cholesterol Problem  The history is provided by the patient. This is a chronic problem. The current episode started more than 1 week ago. The problem occurs constantly. The problem has not changed since onset.Pertinent negatives include no chest pain, no abdominal pain and no shortness of breath. The symptoms are aggravated by eating. Relieved by: TLCs. Treatments tried: TLCs. The treatment provided moderate relief.     Dustin Noble is a Caucasian male who first came to me 07/2012.     1. HLD (272.4): Controlled with TLCs.   a. Context:   i. ATP Risk Factors =   ii. Framingham =   iii. Goal LDL =   b. FLPs:   i. 07/30/12 Untreated = [193/114/50/145].   ii. 03/22/13 Untreated = [201/136/55/51].   iii. 03/30/14 Untreated = [181/122/49/50].   iv. 04/03/14 Untreated = [179/120/48/56].   2. Insomnia (780.52): 03/22/13 started on Trazodone which he's taking without problems and with good control of symptoms.   3. Chronic Back Pain: On Suboxone from Dr. Diamond NickelSherbondy.   4. HCM:   ? Colonoscopy: 09/13/08 in [place] = Tics; No polyps; 7-10 Year Repeat Rec.  He prefers 10 years, but might consider next year.   ? Prostate:   o PSA's:  1. 07/30/12 = 0.4.  2. 03/30/14 = 0.3.   3. 04/04/15 = 0.3.   ? Pneumovax:   ? Td:   ? Flu: Elsewhere 2015.    Current Outpatient Prescriptions   Medication Sig Dispense Refill   ??? traZODone (DESYREL) 50 mg tablet TAKE 1 TABLET BY MOUTH NIGHTLY 90 Tab 1     No Known Allergies  Past Medical History   Diagnosis Date   ??? Chronic low back pain    ??? Anxiety    ??? Insomnia 09/29/2013     No past surgical history on file.  History     Social History   ??? Marital Status: MARRIED     Spouse Name: N/A   ??? Number of Children: N/A   ??? Years of Education: N/A     Occupational History   ??? Sells vascular stents.        Social History Main Topics   ??? Smoking status: Never Smoker    ??? Smokeless tobacco: Never Used   ??? Alcohol Use: No   ??? Drug Use: No   ??? Sexual Activity: Not on file     Other Topics Concern   ??? Not on file     Social History Narrative     Family History   Problem Relation Age of Onset   ??? Diabetes Brother      Dm1   ??? Cancer Neg Hx    ??? Heart Disease Neg Hx    ??? Heart Attack Neg Hx      Review of Systems   Respiratory: Negative for cough, sputum production, shortness of breath and wheezing.    Cardiovascular: Negative for chest pain, palpitations, claudication and leg swelling.   Gastrointestinal: Negative for nausea, vomiting, abdominal pain, blood in stool and melena.     Physical Exam   Constitutional: He is oriented to person, place, and time. He appears well-developed and well-nourished. No distress.   HENT:   Head: Normocephalic and atraumatic.   Neck: Normal range  of motion. Neck supple. No JVD present. No tracheal deviation present. No thyromegaly present.   Cardiovascular: Normal rate, regular rhythm, normal heart sounds and intact distal pulses.  Exam reveals no gallop and no friction rub.    No murmur heard.  Pulmonary/Chest: Effort normal and breath sounds normal. No stridor. No respiratory distress. He has no wheezes. He has no rales. He exhibits no tenderness.   Abdominal: Soft. Bowel sounds are normal. He exhibits no distension and no mass. There is no tenderness. There is no rebound and no guarding.   Musculoskeletal: He exhibits no edema.   Lymphadenopathy:     He has no cervical adenopathy.   Neurological: He is alert and oriented to person, place, and time.   Skin: Skin is warm and dry. No rash noted. He is not diaphoretic. No erythema. No pallor.   Psychiatric: He has a normal mood and affect. His behavior is normal. Judgment and thought content normal.   Nursing note and vitals reviewed.    ASSESSMENT and PLAN    1. HLD (272.4): Well controlled off medications.  Continue TLCs.    2. Insomnia (780.52): Well controlled.  Continue current regimen.   3. Chronic Low Back Pain (724.5): Follow up with Dr. Diamond Nickel.  4. HCM:   5. F/u: 6 Months.  No planned labs at that visit.    Diagnoses and all orders for this visit:    Mixed hyperlipidemia  Orders:  -     BMP  -     CBC  -     Lipid Panel    Primary insomnia  Orders:  -     traZODone (DESYREL) 50 mg tablet; TAKE 1 TABLET BY MOUTH NIGHTLY    Chronic bilateral low back pain without sciatica    Prostate cancer screening  Orders:  -     PSA

## 2015-07-18 ENCOUNTER — Encounter

## 2015-07-18 MED ORDER — TRAZODONE 50 MG TAB
50 mg | ORAL_TABLET | ORAL | 1 refills | Status: DC
Start: 2015-07-18 — End: 2015-10-11

## 2015-07-18 NOTE — Telephone Encounter (Signed)
Med refill request

## 2015-09-24 ENCOUNTER — Institutional Professional Consult (permissible substitution)
Admit: 2015-09-24 | Discharge: 2015-09-24 | Payer: BLUE CROSS/BLUE SHIELD | Attending: Internal Medicine | Primary: Internal Medicine

## 2015-09-24 DIAGNOSIS — Z111 Encounter for screening for respiratory tuberculosis: Secondary | ICD-10-CM

## 2015-09-24 NOTE — Progress Notes (Signed)
PPD placed on left forearm

## 2015-09-26 ENCOUNTER — Institutional Professional Consult (permissible substitution)
Admit: 2015-09-26 | Discharge: 2015-09-26 | Payer: BLUE CROSS/BLUE SHIELD | Attending: Internal Medicine | Primary: Internal Medicine

## 2015-09-26 DIAGNOSIS — Z111 Encounter for screening for respiratory tuberculosis: Secondary | ICD-10-CM

## 2015-09-26 LAB — AMB POC TUBERCULOSIS, INTRADERMAL (SKIN TEST)
PPD: 0 Negative
mm Induration: 0 mm

## 2015-09-26 NOTE — Progress Notes (Signed)
PPD Reading was negative

## 2015-10-11 ENCOUNTER — Ambulatory Visit
Admit: 2015-10-11 | Discharge: 2015-10-11 | Payer: BLUE CROSS/BLUE SHIELD | Attending: Internal Medicine | Primary: Internal Medicine

## 2015-10-11 DIAGNOSIS — E782 Mixed hyperlipidemia: Secondary | ICD-10-CM

## 2015-10-11 MED ORDER — TRAZODONE 50 MG TAB
50 mg | ORAL_TABLET | ORAL | 1 refills | Status: DC
Start: 2015-10-11 — End: 2015-10-11

## 2015-10-11 MED ORDER — TRAZODONE 50 MG TAB
50 mg | ORAL_TABLET | ORAL | 1 refills | Status: DC
Start: 2015-10-11 — End: 2016-05-07

## 2015-10-11 NOTE — Progress Notes (Signed)
Dustin Noble, M.D.  Internal Medicine  [facility]  [address] [place], [place] [address]  Phone: [phone] Fax: [phone]    HISTORY OF PRESENT ILLNESS  Dustin Noble is a 55 y.o. male.  Cholesterol Problem   The history is provided by the patient. This is a chronic problem. The current episode started more than 1 week ago. The problem occurs constantly. The problem has not changed since onset.Pertinent negatives include no chest pain, no abdominal pain and no shortness of breath. The symptoms are aggravated by eating. Relieved by: TLCs. Treatments tried: TLCs. The treatment provided moderate relief.     Dustin Noble is a Caucasian male who first came to me 07/2012.     1. HLD (272.4): Controlled with TLCs.   a. Context:   i. ATP Risk Factors =   ii. [place] =   iii. Goal LDL =   b. FLPs:   i. 07/30/12 Untreated = [193/114/50/145].   ii. 03/22/13 Untreated = [201/136/55/51].   iii. 03/30/14 Untreated = [181/122/49/50].   iv. 04/04/15 Untreated = [179/120/48/56].   2. Insomnia (780.52): 03/22/13 started on Trazodone which he's taking without problems and with good control of symptoms.   3. Chronic Back Pain: On Suboxone from Dr. Diamond Nickel.   4. HCM:   ? Colonoscopy: 09/13/08 in [place] = Tics; No polyps; 7-10 Year Repeat Rec.  He is thinking about doing 8-9.   ? Prostate:   o PSA's:  1. 07/30/12 = 0.4.  2. 03/30/14 = 0.3.   3. 04/04/15 = 0.3.   ? Pneumovax:   ? Td:   ? Flu: Elsewhere 2016.    Current Outpatient Prescriptions   Medication Sig Dispense Refill   ??? traZODone (DESYREL) 50 mg tablet TAKE 1 TABLET BY MOUTH NIGHTLY 90 Tab 1     No Known Allergies  Past Medical History   Diagnosis Date   ??? Anxiety    ??? Chronic low back pain    ??? Insomnia 09/29/2013     No past surgical history on file.  Social History     Social History   ??? Marital status: MARRIED     Spouse name: N/A   ??? Number of children: N/A   ??? Years of education: N/A     Occupational History   ??? Sells vascular stents.  [employer]      Social History Main Topics   ??? Smoking status: Never Smoker   ??? Smokeless tobacco: Never Used   ??? Alcohol use No   ??? Drug use: No   ??? Sexual activity: Not on file     Other Topics Concern   ??? Not on file     Social History Narrative     Family History   Problem Relation Age of Onset   ??? Diabetes Brother      Dm1   ??? Cancer Neg Hx    ??? Heart Disease Neg Hx    ??? Heart Attack Neg Hx      Review of Systems   Respiratory: Negative for cough, sputum production, shortness of breath and wheezing.    Cardiovascular: Negative for chest pain, palpitations, claudication and leg swelling.   Gastrointestinal: Negative for abdominal pain, blood in stool, melena, nausea and vomiting.     Physical Exam   Constitutional: He is oriented to person, place, and time. He appears well-developed and well-nourished. No distress.   HENT:   Head: Normocephalic and atraumatic.   Neck: Normal range of  motion. Neck supple. No JVD present. No tracheal deviation present. No thyromegaly present.   Cardiovascular: Normal rate, regular rhythm, normal heart sounds and intact distal pulses.  Exam reveals no gallop and no friction rub.    No murmur heard.  Pulmonary/Chest: Effort normal and breath sounds normal. No stridor. No respiratory distress. He has no wheezes. He has no rales. He exhibits no tenderness.   Abdominal: Soft. Bowel sounds are normal. He exhibits no distension and no mass. There is no tenderness. There is no rebound and no guarding.   Musculoskeletal: He exhibits no edema.   Lymphadenopathy:     He has no cervical adenopathy.   Neurological: He is alert and oriented to person, place, and time.   Skin: Skin is warm and dry. No rash noted. He is not diaphoretic. No erythema. No pallor.   Psychiatric: He has a normal mood and affect. His behavior is normal. Judgment and thought content normal.   Nursing note and vitals reviewed.    ASSESSMENT and PLAN    1. HLD (272.4): Well controlled off medications.  Continue TLCs.    2. Insomnia (780.52): Well controlled.  Continue current regimen.   3. Chronic Low Back Pain (724.5): Follow up with Dr. Diamond Nickel.  4. HCM:   5. F/u: 6 Months.  No planned labs at that visit.    Diagnoses and all orders for this visit:    Mixed hyperlipidemia    Primary insomnia  -     traZODone (DESYREL) 50 mg tablet; TAKE 1 TABLET BY MOUTH NIGHTLY

## 2015-10-11 NOTE — Addendum Note (Signed)
Addended by: Arna Snipe on: 10/11/2015 08:34 AM      Modules accepted: Orders

## 2015-12-05 ENCOUNTER — Ambulatory Visit
Admit: 2015-12-05 | Discharge: 2015-12-05 | Payer: BLUE CROSS/BLUE SHIELD | Attending: Internal Medicine | Primary: Internal Medicine

## 2015-12-05 DIAGNOSIS — J302 Other seasonal allergic rhinitis: Secondary | ICD-10-CM

## 2015-12-05 MED ORDER — MONTELUKAST 10 MG TAB
10 mg | ORAL_TABLET | Freq: Every day | ORAL | 0 refills | Status: DC
Start: 2015-12-05 — End: 2016-05-10

## 2015-12-05 MED ORDER — FLUTICASONE 50 MCG/ACTUATION NASAL SPRAY, SUSP
50 mcg/actuation | Freq: Every day | NASAL | 5 refills | Status: DC
Start: 2015-12-05 — End: 2018-12-23

## 2015-12-05 NOTE — Progress Notes (Signed)
Lona Millard, M.D.  Internal Medicine  [facility]  [address] [place], [place] [address]   Phone: [phone]  Fax: [phone]      HISTORY OF PRESENT ILLNESS  Dustin Noble is a 55 y.o. male.  Cough   The history is provided by the patient. This is a chronic problem. The current episode started more than 1 week ago. The problem occurs constantly. The problem has not changed since onset.Pertinent negatives include no chest pain, no abdominal pain and no shortness of breath. Nothing aggravates the symptoms. He has tried nothing for the symptoms. The treatment provided no relief.     About 2.5 weeks ago had severe productive cough and subjective fevers.  Also myalgais.  This improved over about a few days to a week and has gradually improved.  Currently he mostly has sneezing and nasal congestion.  Some itchy/watery eyes.     Current Outpatient Prescriptions   Medication Sig Dispense Refill   ??? traZODone (DESYREL) 50 mg tablet TAKE 1 TABLET BY MOUTH NIGHTLY 90 Tab 1     No Known Allergies  Past Medical History:   Diagnosis Date   ??? Anxiety    ??? Chronic low back pain    ??? Insomnia 09/29/2013     No past surgical history on file.  Social History     Social History   ??? Marital status: MARRIED     Spouse name: N/A   ??? Number of children: N/A   ??? Years of education: N/A     Occupational History   ??? Sells vascular stents.  [employer]     Social History Main Topics   ??? Smoking status: Never Smoker   ??? Smokeless tobacco: Never Used   ??? Alcohol use No   ??? Drug use: No   ??? Sexual activity: Not on file     Other Topics Concern   ??? Not on file     Social History Narrative     Family History   Problem Relation Age of Onset   ??? Diabetes Brother      Dm1   ??? Cancer Neg Hx    ??? Heart Disease Neg Hx    ??? Heart Attack Neg Hx        Review of Systems   Constitutional: Positive for fever.        Subjective fever about 2.5 weeks ago but not recently.     HENT: Positive for congestion. Negative for sore throat.    Respiratory: Positive for cough. Negative for shortness of breath.    Cardiovascular: Negative for chest pain and leg swelling.   Gastrointestinal: Negative for abdominal pain, blood in stool and melena.   Musculoskeletal: Positive for myalgias.       Physical Exam   Constitutional: He is oriented to person, place, and time. He appears well-developed and well-nourished. No distress.   HENT:   Head: Normocephalic and atraumatic.   Neck: Neck supple. No JVD present. No tracheal deviation present. No thyromegaly present.   Cardiovascular: Normal rate, regular rhythm, normal heart sounds and intact distal pulses.  Exam reveals no gallop and no friction rub.    No murmur heard.  Pulmonary/Chest: Effort normal and breath sounds normal. No respiratory distress. He has no wheezes. He has no rales. He exhibits no tenderness.   Abdominal: Soft. Bowel sounds are normal. He exhibits no distension and no mass. There is no tenderness. There is no rebound and no guarding.  Musculoskeletal: He exhibits no edema.   Lymphadenopathy:     He has no cervical adenopathy.   Neurological: He is alert and oriented to person, place, and time.   Skin: Skin is warm and dry. No rash noted. He is not diaphoretic.   Psychiatric: He has a normal mood and affect. His behavior is normal. Judgment and thought content normal.   Nursing note and vitals reviewed.      ASSESSMENT and PLAN  Cristal DeerChristopher was seen today for cold symptoms.    Diagnoses and all orders for this visit:    Seasonal allergic rhinitis, unspecified allergic rhinitis trigger  -     montelukast (SINGULAIR) 10 mg tablet; Take 1 Tab by mouth daily.  -     fluticasone (FLONASE) 50 mcg/actuation nasal spray; 2 Sprays by Both Nostrils route daily.  Symptomatic measures discussed.  Patient instructed to call us immediately if symptoms worse, change, or do not improve.

## 2016-02-05 ENCOUNTER — Ambulatory Visit
Admit: 2016-02-05 | Discharge: 2016-02-05 | Payer: BLUE CROSS/BLUE SHIELD | Attending: Internal Medicine | Primary: Internal Medicine

## 2016-02-05 DIAGNOSIS — G8929 Other chronic pain: Secondary | ICD-10-CM

## 2016-02-05 MED ORDER — CYCLOBENZAPRINE 10 MG TAB
10 mg | ORAL_TABLET | Freq: Three times a day (TID) | ORAL | 0 refills | Status: DC | PRN
Start: 2016-02-05 — End: 2018-12-23

## 2016-02-05 MED ORDER — CELECOXIB 200 MG CAP
200 mg | ORAL_CAPSULE | Freq: Two times a day (BID) | ORAL | 0 refills | Status: AC
Start: 2016-02-05 — End: 2016-02-19

## 2016-02-05 NOTE — Progress Notes (Signed)
Dustin MillardJohn E. Naftula Noble, M.D.  Internal Medicine  [facility]  [address] [place], [place] [id number]   Phone: [phone]  Fax: [phone]    HISTORY OF PRESENT ILLNESS  Dustin BunchChristopher A Noble is a 55 y.o. male.  Back Pain    The history is provided by the patient. This is a chronic problem. The current episode started more than 1 week ago. The problem has not changed since onset.The problem occurs daily. Patient reports work related injury.The pain is associated with lifting and twisting. The pain is present in the lumbar spine and right side. The pain does not radiate. The pain is at a severity of 5/10. The pain is moderate. The symptoms are aggravated by bending, twisting and certain positions. The pain is the same all the time. Pertinent negatives include no chest pain and no abdominal pain. He has tried NSAIDs for the symptoms. The treatment provided no relief. Risk factors: None.     Current Outpatient Prescriptions   Medication Sig Dispense Refill   ??? montelukast (SINGULAIR) 10 mg tablet Take 1 Tab by mouth daily. 90 Tab 0   ??? fluticasone (FLONASE) 50 mcg/actuation nasal spray 2 Sprays by Both Nostrils route daily. 1 Bottle 5   ??? traZODone (DESYREL) 50 mg tablet TAKE 1 TABLET BY MOUTH NIGHTLY 90 Tab 1     No Known Allergies  Past Medical History:   Diagnosis Date   ??? Anxiety    ??? Chronic low back pain    ??? Insomnia 09/29/2013     No past surgical history on file.  Social History     Social History   ??? Marital status: MARRIED     Spouse name: N/A   ??? Number of children: N/A   ??? Years of education: N/A     Occupational History   ??? Sells vascular stents.  [employer]     Social History Main Topics   ??? Smoking status: Never Smoker   ??? Smokeless tobacco: Never Used   ??? Alcohol use No   ??? Drug use: No   ??? Sexual activity: Not on file     Other Topics Concern   ??? Not on file     Social History Narrative     Family History   Problem Relation Age of Onset   ??? Diabetes Brother      Dm1   ??? Cancer Neg Hx     ??? Heart Disease Neg Hx    ??? Heart Attack Neg Hx        Review of Systems   Respiratory: Negative for cough and shortness of breath.    Cardiovascular: Negative for chest pain and leg swelling.   Gastrointestinal: Negative for abdominal pain, blood in stool and melena.   Musculoskeletal: Positive for back pain.       Physical Exam   Constitutional: He is oriented to person, place, and time. He appears well-developed and well-nourished. No distress.   HENT:   Head: Normocephalic and atraumatic.   Musculoskeletal:   Mild TTP in the right paraspinal muscles in the LS spine.  Pain reproduced on flexion/extension of the LS spine.  SLR negative.    Neurological: He is alert and oriented to person, place, and time. He has normal reflexes. He displays normal reflexes. No cranial nerve deficit. He exhibits normal muscle tone. Coordination normal.   Skin: Skin is warm and dry. No rash noted. He is not diaphoretic. No erythema. No pallor.   Psychiatric: He has  a normal mood and affect. His behavior is normal. Judgment and thought content normal.       ASSESSMENT and PLAN  Diagnoses and all orders for this visit:    Chronic right-sided low back pain without sciatica  -     REFERRAL TO PHYSICAL THERAPY    Other orders  -     celecoxib (CELEBREX) 200 mg capsule; Take 1 Cap by mouth two (2) times a day for 14 days.  -     cyclobenzaprine (FLEXERIL) 10 mg tablet; Take 1 Tab by mouth three (3) times daily as needed for Muscle Spasm(s).    Symptomatic measures discussed.  Patient instructed to call us immediately if symptoms worse, change, or do not improve.

## 2016-02-19 ENCOUNTER — Inpatient Hospital Stay: Admit: 2016-02-19 | Payer: BLUE CROSS/BLUE SHIELD | Primary: Internal Medicine

## 2016-02-19 DIAGNOSIS — M545 Low back pain, unspecified: Secondary | ICD-10-CM

## 2016-02-19 MED ORDER — MELOXICAM 7.5 MG TAB
7.5 mg | ORAL_TABLET | Freq: Every day | ORAL | 1 refills | Status: DC
Start: 2016-02-19 — End: 2016-10-16

## 2016-02-19 NOTE — Progress Notes (Signed)
Ambulatory/Rehab Services H2 Model Falls Risk Assessment    Risk Factor Pts. ??   Confusion/Disorientation/Impulsivity      4 ??   Symptomatic Depression     2 ??   Altered Elimination     1 ??   Dizziness/Vertigo     1 ??   Gender (Male)     1 ??   Any administered antiepileptics (anticonvulsants):     2 ??   Any administered benzodiazepines:     1 ??   Visual Impairment (specify):     1 ??   Portable Oxygen Use     1 ??   Orthostatic ? BP     1 ??   History of Recent Falls (within 3 mos.)     5     Ability to Rise from Chair (choose one) Pts. ??   Ability to rise in a single movement     0 ??   Pushes up, successful in one attempt     1 ??   Multiple attempts, but successful     3 ??   Unable to rise without assistance     4   Total: (5 or greater = High Risk) 1     Falls Prevention Plan:                   Physical Limitations to Exercise (specify):                   Mobility Assistance Device (type):                   Exercise/Equipment Adaptation (specify):    ??2010 [employer] All Rights Reserved. [place] Patent #7,282,031. Federal Law prohibits the replication, distribution or use without written permission from [employer]

## 2016-02-19 NOTE — Progress Notes (Signed)
Eudelia Bunchhristopher A Shepperson  DOB: [birth year] [facility]  [address], [address], [place] [id number]  [phone]   Fax:[phone]        OUTPATIENT PHYSICAL THERAPY:Initial Assessment 02/19/2016    ICD-10: Treatment Diagnosis: Low back pain (M54.5)  Precautions/Allergies:   Review of patient's allergies indicates no known allergies.   Fall Risk Score: 1 (? 5 = High Risk)  MD Orders: Evaluate and Treat MEDICAL/REFERRING DIAGNOSIS:  Chronic right-sided low back pain without sciatica [M54.5, G89.29]   DATE OF ONSET: Chronic  REFERRING PHYSICIAN: Lona MillardLacy, John E, MD  RETURN PHYSICIAN APPOINTMENT: August 1st, 2017     INITIAL ASSESSMENT:  [name] presents to physical therapy with a history of chronic right sided low back pain. Physical therapy evaluation demonstrates flexed/poor posture, tightness to hips and low back, improved mobility following extension based exercises. Patient would benefit from skilled physical therapy to address his deficits and maximize his function.    PROBLEM LIST (Impacting functional limitations):  1. Decreased Strength  2. Increased Pain  3. Decreased Flexibility/Joint Mobility INTERVENTIONS PLANNED:  1. Cold  2. Electrical Stimulation  3. Family Education  4. Heat  5. Home Exercise Program (HEP)  6. Manual Therapy  7. Range of Motion (ROM)  8. Therapeutic Exercise/Strengthening   TREATMENT PLAN:  Effective Dates: 02-19-16 TO 05-21-16.    Frequency/Duration: 2 times a week for 4 weeks  GOALS: (Goals have been discussed and agreed upon with patient.)  Short Term Goals  1. Pt will be independent with comprehensive HEP to centralize and reduce low back pain  2. Pt will participate in LE stretching program to increase flexibility  3. Pt will participate in core stabilization exercises to help with stabilization during ADLs  4. Pt will tolerate manual therapy/joint mobilizations/soft tissue to increase ROM and decrease pain   5. Pt will report <3/10 pain to demonstrate improvement in function  6. Pt will demonstrate a 5 point decreased on the Modified Oswestry Low Back Disability Questionnaire to show improvement in function  Long Term Goals  1. Pt will demonstrate a 10 point decrease in the Modified Oswestry Low Back Disability Questionnaire to show improvement in function  2. Pt will be able to flex and extend lumbar spine without peripheralization of symptoms to demonstrate reduced derangement   Rehabilitation Potential For Stated Goals: Good  Regarding Cristal DeerChristopher A Wroe's therapy, I certify that the treatment plan above will be carried out by a therapist or under their direction.  Thank you for this referral,  Danise MinaKristin M Dariush Mcnellis, DPT     Referring Physician Signature: Lona MillardLacy, John E, MD              Date                    HISTORY:   History of Present Injury/Illness (Reason for Referral):  Patient reports having chronic back pain.  Over the last couple months it has gotten really painful in the mornings and standing in the OR. Had a discectomy in 2012. This helped relieve some of the pain to some degree. Had a series of injections in [place] in 2014. This helped for a while. Pain is worse with standing, bending. Pain is better with sitting. Walking can cause discomfort. 6-8 weeks ago started doing more cardio (eliptical and walking) which helps loosen the back up. Reports extreme stiffness after driving. Sleeps on his side at night. Pain is located on the right side of the back.  Denies numbness/tingling. Does not take pain medicine. Has not used ice/heat.    Past Medical History/Comorbidities:   Mr. Fitton  has a past medical history of Anxiety; Chronic low back pain; and Insomnia (09/29/2013). He also has no past medical history of Arthritis; Asthma; Autoimmune disease (HCC); CAD (coronary artery disease); Cancer (HCC); Chronic kidney disease; Chronic obstructive pulmonary disease (HCC); Diabetes (HCC); Endocrine disease;  Gastrointestinal disorder; Heart failure (HCC); Hypertension; Infectious disease; Liver disease; Neurological disorder; Other ill-defined conditions; Psychiatric disorder; PUD (peptic ulcer disease); Seizures (HCC); Stroke [facility]); or Thromboembolus (HCC).  Mr. Courville  has no past surgical history on file.  Social History/Living Environment:    Lives with spouse. 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        Profession:  Standing and driving  Current Medications:    Current Outpatient Prescriptions:   ???  meloxicam (MOBIC) 7.5 mg tablet, Take 1 Tab by mouth daily., Disp: 90 Tab, Rfl: 1  ???  celecoxib (CELEBREX) 200 mg capsule, Take 1 Cap by mouth two (2) times a day for 14 days., Disp: 28 Cap, Rfl: 0  ???  cyclobenzaprine (FLEXERIL) 10 mg tablet, Take 1 Tab by mouth three (3) times daily as needed for Muscle Spasm(s)., Disp: 30 Tab, Rfl: 0  ???  montelukast (SINGULAIR) 10 mg tablet, Take 1 Tab by mouth daily., Disp: 90 Tab, Rfl: 0  ???  fluticasone (FLONASE) 50 mcg/actuation nasal spray, 2 Sprays by Both Nostrils route daily., Disp: 1 Bottle, Rfl: 5  ???  traZODone (DESYREL) 50 mg tablet, TAKE 1 TABLET BY MOUTH NIGHTLY, Disp: 90 Tab, Rfl: 1     Date Last Reviewed:  02/19/2016    Number of Personal Factors/Comorbidities that affect the Plan of Care: 0: LOW COMPLEXITY   EXAMINATION:   Observation/Orthostatic Postural Assessment:          In sitting patient with very slouched posture. Rests forearms on legs. Postural correction does not change symptoms. In standing patient with decreased lumbar lordosis  Palpation:          Extension mobilization L2-L4 increases low back pain   ROM:          Lumbar Movement Loss:  Flexion- nil, pain upon rising  Extension- moderate, pain at end range    Strength:     LE:  Hip Flexion 5/5  Hip Extension 5/5  Hip Abduction 5/5  Knee Flexion 5/5  Knee Extension 5/5  Ankle Dorsiflexion 5/5  Ankle Plantarflexion 5/5  Special Tests:      B=Better NB= No Better S=Same W=Worse NW= No Worse  A= Abolish P=Produces (+) =increasses  (-) = decreases  Activity     reps  during  after  Activity recheck     Activity recheck        Prone Press Ups 10 increased NW + ROM  Flexibility:  ?? Thomas Test- positive bilateral  ?? Prone Knee Bend- positive bilateral    Neurological Screen:        Myotomes:  intact        Dermatomes:  intact     Body Structures Involved:  1. Joints  2. Muscles Body Functions Affected:  1. Neuromusculoskeletal  2. Movement Related Activities and Participation Affected:  1. General Tasks and Demands  2. Mobility  3. Community, Social and [employer]   Number of elements that affect the Plan of Care: 1-2: LOW COMPLEXITY   CLINICAL PRESENTATION:   Presentation: Stable and uncomplicated: LOW COMPLEXITY   CLINICAL DECISION MAKING:   Outcome Measure:   Tool Used: Modified Oswestry Low Back Pain Questionnaire  Score:  Initial: 18/50  Most Recent: X/50 (Date: -- )       Medical Necessity:   ?? Patient demonstrates good rehab potential due to higher previous functional level.  Reason for Services/Other Comments:  ?? Patient continues to require skilled intervention due to pain and stiffness limiting mobility.   Use of outcome tool(s) and clinical judgement create a POC that gives a: Clear prediction of patient's progress: LOW COMPLEXITY   TREATMENT:   Pre-treatment Symptoms:  n/a  (In addition to Assessment/Re-Assessment sessions the following treatments were rendered)  THERAPEUTIC EXERCISE: ( minutes):  Exercises per grid below to improve  mobility and strength.  Required minimal visual, verbal and manual cues to promote proper body alignment and promote proper body posture.  Progressed resistance, range and repetitions as indicated.   Date:  02-19-16 Date:   Date:     Activity/Exercise Parameters Parameters Parameters   Patient Education Discussed HEP (lumbar roll, standing and prone press ups) and POC                                               Treatment/Session Assessment:  See assessment above.  ?? Pain: Initial:    3/10 Post Session:   ??   Compliance with Program/Exercises: compliant all of the time.  ?? Recommendations/Intent for next treatment session: "Next visit will focus on advancements to more challenging activities and reduction in assistance provided".  Future Appointments  Date Time Provider Department Center   04/15/2016 9:00 AM Lona Millard, MD [facility]     Total Treatment Duration:  PT Patient Time In/Time Out  Time In: 1345  Time Out: 1445    Danise Mina, DPT

## 2016-02-19 NOTE — Telephone Encounter (Signed)
Pt notified that Celebrex is not being covered by his insurance plan. Per Dr. Westly PamLacy switch the patient to Mobic 7.5 mg daily

## 2016-02-21 ENCOUNTER — Inpatient Hospital Stay: Admit: 2016-02-21 | Payer: BLUE CROSS/BLUE SHIELD | Primary: Internal Medicine

## 2016-02-21 NOTE — Progress Notes (Signed)
Eudelia Bunch  DOB: 1961-05-20 [facility]  [address], [place] [address]  [phone]   Fax:[phone]        OUTPATIENT PHYSICAL THERAPY:Daily Note 02/21/2016    ICD-10: Treatment Diagnosis: Low back pain (M54.5)  Precautions/Allergies:   Review of patient's allergies indicates no known allergies.   Fall Risk Score: 1 (? 5 = High Risk)  MD Orders: Evaluate and Treat MEDICAL/REFERRING DIAGNOSIS:  low back pain   DATE OF ONSET: Chronic  REFERRING PHYSICIAN: Lona Millard, MD  RETURN PHYSICIAN APPOINTMENT: August 1st, 2017     INITIAL ASSESSMENT:  Mr. Nay presents to physical therapy with a history of chronic right sided low back pain. Physical therapy evaluation demonstrates flexed/poor posture, tightness to hips and low back, improved mobility following extension based exercises. Patient would benefit from skilled physical therapy to address his deficits and maximize his function.    PROBLEM LIST (Impacting functional limitations):  1. Decreased Strength  2. Increased Pain  3. Decreased Flexibility/Joint Mobility INTERVENTIONS PLANNED:  1. Cold  2. Electrical Stimulation  3. Family Education  4. Heat  5. Home Exercise Program (HEP)  6. Manual Therapy  7. Range of Motion (ROM)  8. Therapeutic Exercise/Strengthening   TREATMENT PLAN:  Effective Dates: 02-19-16 TO 05-21-16.    Frequency/Duration: 2 times a week for 4 weeks  GOALS: (Goals have been discussed and agreed upon with patient.)  Short Term Goals  1. Pt will be independent with comprehensive HEP to centralize and reduce low back pain  2. Pt will participate in LE stretching program to increase flexibility  3. Pt will participate in core stabilization exercises to help with stabilization during ADLs  4. Pt will tolerate manual therapy/joint mobilizations/soft tissue to increase ROM and decrease pain  5. Pt will report <3/10 pain to demonstrate improvement in function   6. Pt will demonstrate a 5 point decreased on the Modified Oswestry Low Back Disability Questionnaire to show improvement in function  Long Term Goals  1. Pt will demonstrate a 10 point decrease in the Modified Oswestry Low Back Disability Questionnaire to show improvement in function  2. Pt will be able to flex and extend lumbar spine without peripheralization of symptoms to demonstrate reduced derangement   Rehabilitation Potential For Stated Goals: Good  Regarding Morio A Timpone's therapy, I certify that the treatment plan above will be carried out by a therapist or under their direction.  Thank you for this referral,  Danise Mina, DPT     Referring Physician Signature: Lona Millard, MD              Date                    HISTORY:   History of Present Injury/Illness (Reason for Referral):  Patient reports having chronic back pain.  Over the last couple months it has gotten really painful in the mornings and standing in the OR. Had a discectomy in 2012. This helped relieve some of the pain to some degree. Had a series of injections in [place] in 2014. This helped for a while. Pain is worse with standing, bending. Pain is better with sitting. Walking can cause discomfort. 6-8 weeks ago started doing more cardio (eliptical and walking) which helps loosen the back up. Reports extreme stiffness after driving. Sleeps on his side at night. Pain is located on the right side of the back. Denies numbness/tingling. Does not take pain  medicine. Has not used ice/heat.    Past Medical History/Comorbidities:   [name]  has a past medical history of Anxiety; Chronic low back pain; and Insomnia (09/29/2013). He also has no past medical history of Arthritis; Asthma; Autoimmune disease (HCC); CAD (coronary artery disease); Cancer (HCC); Chronic kidney disease; Chronic obstructive pulmonary disease (HCC); Diabetes (HCC); Endocrine disease; Gastrointestinal disorder; Heart failure (HCC); Hypertension; Infectious  disease; Liver disease; Neurological disorder; Other ill-defined conditions; Psychiatric disorder; PUD (peptic ulcer disease); Seizures (HCC); Stroke [facility](HCC); or Thromboembolus (HCC).  [name]  has no past surgical history on file.  Social History/Living Environment:    Lives with spouse. 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        Profession:  Standing and driving  Current Medications:    Current Outpatient Prescriptions:   ???  meloxicam (MOBIC) 7.5 mg tablet, Take 1 Tab by mouth daily., Disp: 90 Tab, Rfl: 1  ???  cyclobenzaprine (FLEXERIL) 10 mg tablet, Take 1 Tab by mouth three (3) times daily as needed for Muscle Spasm(s)., Disp: 30 Tab, Rfl: 0  ???  montelukast (SINGULAIR) 10 mg tablet, Take 1 Tab by mouth daily., Disp: 90 Tab, Rfl: 0  ???  fluticasone (FLONASE) 50 mcg/actuation nasal spray, 2 Sprays by Both Nostrils route daily., Disp: 1 Bottle, Rfl: 5  ???  traZODone (DESYREL) 50 mg tablet, TAKE 1 TABLET BY MOUTH NIGHTLY, Disp: 90 Tab, Rfl: 1     Date Last Reviewed:  02/21/2016    Number of Personal Factors/Comorbidities that affect the Plan of Care: 0: LOW COMPLEXITY   EXAMINATION:   Observation/Orthostatic Postural Assessment:          In sitting patient with very slouched posture. Rests forearms on legs. Postural correction does not change symptoms. In standing patient with decreased lumbar lordosis  Palpation:          Extension mobilization L2-L4 increases low back pain  ROM:          Lumbar Movement Loss:  Flexion- nil, pain upon rising  Extension- moderate, pain at end range    Strength:     LE:  Hip Flexion 5/5  Hip Extension 5/5  Hip Abduction 5/5   Knee Flexion 5/5  Knee Extension 5/5  Ankle Dorsiflexion 5/5  Ankle Plantarflexion 5/5  Special Tests:      B=Better NB= No Better S=Same W=Worse NW= No Worse  A= Abolish P=Produces (+) =increasses  (-) = decreases  Activity     reps  during  after  Activity recheck     Activity recheck        Prone Press Ups 10 increased NW + ROM  Flexibility:  ?? Thomas Test- positive bilateral  ?? Prone Knee Bend- positive bilateral    Neurological Screen:        Myotomes:  intact        Dermatomes:  intact     Body Structures Involved:  1. Joints  2. Muscles Body Functions Affected:  1. Neuromusculoskeletal  2. Movement Related Activities and Participation Affected:  1. General Tasks and Demands  2. Mobility  3. Community, Social and Civic Life   CLINICAL PRESENTATION:   CLINICAL DECISION MAKING:   Outcome Measure:   Tool Used: Modified Oswestry Low Back Pain Questionnaire  Score:  Initial: 18/50  Most Recent: X/50 (Date: -- )       Medical Necessity:   ?? Patient demonstrates good rehab potential due to higher previous functional level.  Reason for Services/Other Comments:  ?? Patient continues to require skilled intervention due to pain and stiffness limiting mobility.   TREATMENT:   Pre-treatment Symptoms:  Patient reports he tried working on his posture.   (In addition to Assessment/Re-Assessment sessions the following treatments were rendered)  THERAPEUTIC EXERCISE: (30 minutes):  Exercises per grid below to improve mobility and strength.  Required minimal visual, verbal and manual cues to promote proper body alignment and promote proper body posture.  Progressed resistance, range and repetitions as indicated.   Date:  02-19-16 Date:  02-21-16 Date:     Activity/Exercise Parameters Parameters Parameters   Patient Education Discussed HEP (lumbar roll, standing and prone press ups) and POC     Bike  Level 1 x 10 minutes    Prone Hip Extension    x10 each leg    Bridge    x15     Thomas Stretch  1 minute holds                  B=Better NB= No Better S=Same W=Worse NW= No Worse  A= Abolish P=Produces (+) =increasses  (-) = decreases  Activity     reps  during  after  Activity recheck     Activity recheck       Prone Press Ups     3 x 10 decreased better +ROM    Prone Press Ups with OP          Extension Mobilization  x5 increased NW     Standing Extensions  x10 decreased better +ROM                          Treatment/Session Assessment:  Patients pain decreased and mobility improved following extension exercises. Discussed increased amount of extension per day  ?? Pain: Initial:  Pain Intensity 1: 4  Post Session:   ??   Compliance with Program/Exercises: compliant all of the time.  ?? Recommendations/Intent for next treatment session: "Next visit will focus on advancements to more challenging activities and reduction in assistance provided".  Future Appointments  Date Time Provider Department Center   02/25/2016 1:00 PM Danise Mina, DPT [facility]   02/28/2016 5:15 PM Danise Mina, DPT [facility]   04/15/2016 9:00 AM Lona Millard, MD [facility]     Total Treatment Duration:  PT Patient Time In/Time Out  Time In: 1715  Time Out: 1745    Danise Mina, DPT

## 2016-02-25 NOTE — Progress Notes (Signed)
Dustin Bunchhristopher A Montesano   (DOB:May 11, 1961) Therapy Center at   [facility]  [address], [address], [place] [id number]  Phone:[phone]   Fax:[phone]       OUTPATIENT DAILY NOTE    NAME/AGE/GENDER: Dustin Noble is a 55 y.o. male.     DATE: 02/25/2016    Patient was a no call no show for appointment today.  Will plan to follow up on next scheduled visit.    Danise MinaKristin M Atha Mcbain, DPT

## 2016-02-26 ENCOUNTER — Inpatient Hospital Stay: Payer: BLUE CROSS/BLUE SHIELD | Primary: Internal Medicine

## 2016-02-26 ENCOUNTER — Inpatient Hospital Stay: Admit: 2016-02-26 | Payer: BLUE CROSS/BLUE SHIELD | Primary: Internal Medicine

## 2016-02-26 NOTE — Progress Notes (Addendum)
Dustin Noble  DOB: 02-28-1961 [facility]  [address], [place] [address]  [phone]   Fax:[phone]        OUTPATIENT PHYSICAL THERAPY:Daily Note 02/26/2016    ICD-10: Treatment Diagnosis: Low back pain (M54.5)  Precautions/Allergies:   Review of patient's allergies indicates no known allergies.   Fall Risk Score: 1 (? 5 = High Risk)  MD Orders: Evaluate and Treat MEDICAL/REFERRING DIAGNOSIS:  low back pain   DATE OF ONSET: Chronic  REFERRING PHYSICIAN: Lona Millard, MD  RETURN PHYSICIAN APPOINTMENT: August 1st, 2017     INITIAL ASSESSMENT:  Dustin Noble presents to physical therapy with a history of chronic right sided low back pain. Physical therapy evaluation demonstrates flexed/poor posture, tightness to hips and low back, improved mobility following extension based exercises. Patient would benefit from skilled physical therapy to address his deficits and maximize his function.    PROBLEM LIST (Impacting functional limitations):  1. Decreased Strength  2. Increased Pain  3. Decreased Flexibility/Joint Mobility INTERVENTIONS PLANNED:  1. Cold  2. Electrical Stimulation  3. Family Education  4. Heat  5. Home Exercise Program (HEP)  6. Manual Therapy  7. Range of Motion (ROM)  8. Therapeutic Exercise/Strengthening   TREATMENT PLAN:  Effective Dates: 02-19-16 TO 05-21-16.    Frequency/Duration: 2 times a week for 4 weeks  GOALS: (Goals have been discussed and agreed upon with patient.)  Short Term Goals  1. Pt will be independent with comprehensive HEP to centralize and reduce low back pain  2. Pt will participate in LE stretching program to increase flexibility  3. Pt will participate in core stabilization exercises to help with stabilization during ADLs  4. Pt will tolerate manual therapy/joint mobilizations/soft tissue to increase ROM and decrease pain  5. Pt will report <3/10 pain to demonstrate improvement in function   6. Pt will demonstrate a 5 point decreased on the Modified Oswestry Low Back Disability Questionnaire to show improvement in function  Long Term Goals  1. Pt will demonstrate a 10 point decrease in the Modified Oswestry Low Back Disability Questionnaire to show improvement in function  2. Pt will be able to flex and extend lumbar spine without peripheralization of symptoms to demonstrate reduced derangement   Rehabilitation Potential For Stated Goals: Good  Regarding Dustin Noble's therapy, I certify that the treatment plan above will be carried out by a therapist or under their direction.  Thank you for this referral,  [place] D Casper     Referring Physician Signature: Lona Millard, MD              Date                    HISTORY:   History of Present Injury/Illness (Reason for Referral):  Patient reports having chronic back pain.  Over the last couple months it has gotten really painful in the mornings and standing in the OR. Had a discectomy in 2012. This helped relieve some of the pain to some degree. Had a series of injections in [place] in 2014. This helped for a while. Pain is worse with standing, bending. Pain is better with sitting. Walking can cause discomfort. 6-8 weeks ago started doing more cardio (eliptical and walking) which helps loosen the back up. Reports extreme stiffness after driving. Sleeps on his side at night. Pain is located on the right side of the back. Denies numbness/tingling. Does not take pain medicine.  Has not used ice/heat.    Past Medical History/Comorbidities:   Dustin Noble  has a past medical history of Anxiety; Chronic low back pain; and Insomnia (09/29/2013). He also has no past medical history of Arthritis; Asthma; Autoimmune disease (HCC); CAD (coronary artery disease); Cancer (HCC); Chronic kidney disease; Chronic obstructive pulmonary disease (HCC); Diabetes (HCC); Endocrine disease; Gastrointestinal disorder; Heart failure (HCC); Hypertension; Infectious  disease; Liver disease; Neurological disorder; Other ill-defined conditions; Psychiatric disorder; PUD (peptic ulcer disease); Seizures (HCC); Stroke [facility]HCC); or Thromboembolus (HCC).  Dustin Noble  has no past surgical history on file.  Social History/Living Environment:    Lives with spouse. 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        Profession:  Standing and driving  Current Medications:    Current Outpatient Prescriptions:   ???  meloxicam (MOBIC) 7.5 mg tablet, Take 1 Tab by mouth daily., Disp: 90 Tab, Rfl: 1  ???  cyclobenzaprine (FLEXERIL) 10 mg tablet, Take 1 Tab by mouth three (3) times daily as needed for Muscle Spasm(s)., Disp: 30 Tab, Rfl: 0  ???  montelukast (SINGULAIR) 10 mg tablet, Take 1 Tab by mouth daily., Disp: 90 Tab, Rfl: 0  ???  fluticasone (FLONASE) 50 mcg/actuation nasal spray, 2 Sprays by Both Nostrils route daily., Disp: 1 Bottle, Rfl: 5  ???  traZODone (DESYREL) 50 mg tablet, TAKE 1 TABLET BY MOUTH NIGHTLY, Disp: 90 Tab, Rfl: 1     Date Last Reviewed:  02/26/2016    EXAMINATION:   Observation/Orthostatic Postural Assessment:          In sitting patient with very slouched posture. Rests forearms on legs. Postural correction does not change symptoms. In standing patient with decreased lumbar lordosis  Palpation:          Extension mobilization L2-L4 increases low back pain  ROM:          Lumbar Movement Loss:  Flexion- nil, pain upon rising  Extension- moderate, pain at end range    Strength:     LE:  Hip Flexion 5/5  Hip Extension 5/5  Hip Abduction 5/5  Knee Flexion 5/5  Knee Extension 5/5  Ankle Dorsiflexion 5/5  Ankle Plantarflexion 5/5   Special Tests:      B=Better NB= No Better S=Same W=Worse NW= No Worse  A= Abolish P=Produces (+) =increasses  (-) = decreases  Activity     reps  during  after  Activity recheck     Activity recheck        Prone Press Ups 10 increased NW + ROM                                                       Flexibility:  ?? Thomas Test- positive bilateral  ?? Prone Knee Bend- positive bilateral  Neurological Screen:        Myotomes:  intact        Dermatomes:  intact     Body Structures Involved:  1. Joints  2. Muscles Body Functions Affected:  1. Neuromusculoskeletal  2. Movement Related Activities and Participation Affected:  1. General Tasks and Demands  2. Mobility  3. Community, Social and Civic Life   CLINICAL PRESENTATION:   CLINICAL DECISION MAKING:   Outcome Measure:   Tool Used: Modified Oswestry Low Back Pain Questionnaire  Score:  Initial: 18/50  Most Recent: X/50 (Date: -- )       Medical Necessity:   ?? Patient demonstrates good rehab potential due to higher previous functional level.  Reason for Services/Other Comments:  ?? Patient continues to require skilled intervention due to pain and stiffness limiting mobility.   TREATMENT:   Pre-treatment Symptoms:  Patient reports he had minimal pain this morning when he woke up.   (In addition to Assessment/Re-Assessment sessions the following treatments were rendered)  THERAPEUTIC EXERCISE: (30 minutes):  Exercises per grid below to improve mobility and strength.  Required minimal visual, verbal and manual cues to promote proper body alignment and promote proper body posture.  Progressed resistance, range and repetitions as indicated.   Date:  02-19-16 Date:  02-21-16 Date:  02-26-16   Activity/Exercise Parameters Parameters Parameters   Patient Education Discussed HEP (lumbar roll, standing and prone press ups) and POC     Bike  Level 1 x 10 minutes Level 1 x 10 minutes   Prone Hip Extension    x10 each leg x10 each leg   Bridge    x15 x15    Thomas Stretch  1 minute holds 1 minute holds                 B=Better NB= No Better S=Same W=Worse NW= No Worse  A= Abolish P=Produces (+) =increasses  (-) = decreases  Activity     reps  during  after  Activity recheck     Activity recheck       Prone Press Ups     2 x 10 decreased better +ROM    Prone Press Ups with OP  x10 increased NW     Extension Mobilization  x5 increased NW     Standing Extensions  x10 decreased better +ROM    Repeated lunges  x10 increased NW                   Treatment/Session Assessment:  Patient demonstrates improved lumbar mobility this session. Adding overpressure to press ups increased end range extension. Repeated lunges provided good hip flexor stretch.   ?? Pain: Initial:    4/10 Post Session:   ??   Compliance with Program/Exercises: compliant all of the time.  ?? Recommendations/Intent for next treatment session: "Next visit will focus on advancements to more challenging activities and reduction in assistance provided".  Future Appointments  Date Time Provider Department Center   02/28/2016 5:15 PM Danise Mina, DPT [facility]   04/15/2016 9:00 AM Lona Millard, MD [facility]     Total Treatment Duration:  PT Patient Time In/Time Out  Time In: 1115  Time Out: 1200    [place] D Casper

## 2016-02-26 NOTE — Progress Notes (Signed)
Dustin Noble  DOB: Mar 07, 1961 Therapy Center at [facility]  [address], [place]  [phone]   [phone]        OUTPATIENT PHYSICAL THERAPY:Discharge 04/02/2016    ICD-10: Treatment Diagnosis: Low back pain (M54.5)  Precautions/Allergies:   Review of patient's allergies indicates no known allergies.   Fall Risk Score: 1 (? 5 = High Risk)  MD Orders: Evaluate and Treat MEDICAL/REFERRING DIAGNOSIS:  low back pain   DATE OF ONSET: Chronic  REFERRING PHYSICIAN: Harlow Asa, MD  RETURN PHYSICIAN APPOINTMENT: August 1st, 2017     INITIAL ASSESSMENT:  Dustin Noble was seen for 3 physical therapy visits since his initial evaluation on June 6th, 2017. He was started on an extension based exercise program. Had 1 no show and cancelled his last appointment. Did not make future appointments. Will discharge at this time.     GOALS: (Goals have been discussed and agreed upon with patient.)  Short Term Goals Goals Not Met   1. Pt will be independent with comprehensive HEP to centralize and reduce low back pain  2. Pt will participate in LE stretching program to increase flexibility  3. Pt will participate in core stabilization exercises to help with stabilization during ADLs  4. Pt will tolerate manual therapy/joint mobilizations/soft tissue to increase ROM and decrease pain  5. Pt will report <3/10 pain to demonstrate improvement in function  6. Pt will demonstrate a 5 point decreased on the Modified Oswestry Low Back Disability Questionnaire to show improvement in function  Long Term Goals  1. Pt will demonstrate a 10 point decrease in the Modified Oswestry Low Back Disability Questionnaire to show improvement in function  2. Pt will be able to flex and extend lumbar spine without peripheralization of symptoms to demonstrate reduced derangement     Regarding Dustin Noble's therapy, I certify that the treatment plan  above will be carried out by a therapist or under their direction.  Thank you for this referral,  Coral Ceo, DPT     Referring Physician Signature: Harlow Asa, MD              Date                    HISTORY:   History of Present Injury/Illness (Reason for Referral):  Patient reports having chronic back pain.  Over the last couple months it has gotten really painful in the mornings and standing in the OR. Had a discectomy in 2012. This helped relieve some of the pain to some degree. Had a series of injections in [place] in 2014. This helped for a while. Pain is worse with standing, bending. Pain is better with sitting. Walking can cause discomfort. 6-8 weeks ago started doing more cardio (eliptical and walking) which helps loosen the back up. Reports extreme stiffness after driving. Sleeps on his side at night. Pain is located on the right side of the back. Denies numbness/tingling. Does not take pain medicine. Has not used ice/heat.    Past Medical History/Comorbidities:   Dustin Noble  has a past medical history of Anxiety; Chronic low back pain; and Insomnia (09/29/2013). He also has no past medical history of Arthritis; Asthma; Autoimmune disease ([place]); CAD (coronary artery disease); Cancer ([place]); Chronic kidney disease; Chronic obstructive pulmonary disease ([place]); Diabetes ([place]); Endocrine disease; Gastrointestinal disorder; Heart failure ([place]); Hypertension; Infectious disease; Liver disease; Neurological disorder; Other ill-defined conditions; Psychiatric disorder; PUD (peptic ulcer  disease); Seizures ([place]); Stroke [facility]); or Thromboembolus ([place]).  Dustin Noble  has no past surgical history on file.  Social History/Living Environment:    Lives with spouse. 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        Profession:  Standing and driving  Current Medications:    Current Outpatient Prescriptions:   ???  disulfiram (ANTABUSE) 500 mg tablet, Take 1 Tab by mouth every morning., Disp: 30 Tab, Rfl: 0  ???  meloxicam (MOBIC) 7.5 mg tablet, Take 1 Tab by mouth daily., Disp: 90 Tab, Rfl: 1  ???  cyclobenzaprine (FLEXERIL) 10 mg tablet, Take 1 Tab by mouth three (3) times daily as needed for Muscle Spasm(s)., Disp: 30 Tab, Rfl: 0  ???  montelukast (SINGULAIR) 10 mg tablet, Take 1 Tab by mouth daily., Disp: 90 Tab, Rfl: 0  ???  fluticasone (FLONASE) 50 mcg/actuation nasal spray, 2 Sprays by Both Nostrils route daily., Disp: 1 Bottle, Rfl: 5  ???  traZODone (DESYREL) 50 mg tablet, TAKE 1 TABLET BY MOUTH NIGHTLY, Disp: 90 Tab, Rfl: 1     Date Last Reviewed:  04/02/2016    EXAMINATION:   Observation/Orthostatic Postural Assessment:          In sitting patient with very slouched posture. Rests forearms on legs. Postural correction does not change symptoms. In standing patient with decreased lumbar lordosis  Palpation:          Extension mobilization L2-L4 increases low back pain  ROM:          Lumbar Movement Loss:  Flexion- nil, pain upon rising  Extension- moderate, pain at end range    Strength:     LE:  Hip Flexion 5/5  Hip Extension 5/5  Hip Abduction 5/5  Knee Flexion 5/5  Knee Extension 5/5  Ankle Dorsiflexion 5/5  Ankle Plantarflexion 5/5  Special Tests:      B=Better NB= No Better S=Same W=Worse NW= No Worse  A= Abolish P=Produces (+) =increasses  (-) = decreases  Activity     reps  during  after  Activity recheck     Activity recheck        Prone Press Ups 10 increased NW + ROM                                                       Flexibility:  ?? Thomas Test- positive bilateral  ?? Prone Knee Bend- positive bilateral    Neurological Screen:         Myotomes:  intact        Dermatomes:  intact     CLINICAL PRESENTATION:   CLINICAL DECISION MAKING:   Outcome Measure:   Tool Used: Modified Oswestry Low Back Pain Questionnaire  Score:  Initial: 18/50  Most Recent: X/50 (Date: -- )  TREATMENT:   Pre-treatment Symptoms:   (In addition to Assessment/Re-Assessment sessions the following treatments were rendered)  THERAPEUTIC EXERCISE: ():  Exercises per grid below to improve mobility and strength.  Required minimal visual, verbal and manual cues to promote proper body alignment and promote proper body posture.  Progressed resistance, range and repetitions as indicated.   Date:  02-19-16 Date:  02-21-16 Date:  02-26-16   Activity/Exercise Parameters Parameters Parameters   Patient Education Discussed HEP (lumbar roll, standing and prone press ups) and POC     Bike  Level 1 x 10 minutes Level 1 x 10 minutes   Prone Hip Extension    x10 each leg x10 each leg   Bridge    x15 x15   Thomas Stretch  1 minute holds 1 minute holds                 B=Better NB= No Better S=Same W=Worse NW= No Worse  A= Abolish P=Produces (+) =increasses  (-) = decreases  Activity     reps  during  after  Activity recheck     Activity recheck       Prone Press Ups     2 x 10 decreased better +ROM    Prone Press Ups with OP  x10 increased NW     Extension Mobilization  x5 increased NW     Standing Extensions  x10 decreased better +ROM    Repeated lunges  x10 increased NW                   Treatment/Session Assessment:  See assessment above  Total Treatment Duration:       Coral Ceo, DPT

## 2016-02-27 ENCOUNTER — Encounter: Payer: BLUE CROSS/BLUE SHIELD | Primary: Internal Medicine

## 2016-02-28 NOTE — Progress Notes (Signed)
Dustin Bunchhristopher A Duling   (DOB:1961/05/09) Therapy Center at   [facility]  [address], [address], [place] [id number]  Phone:[phone]   Fax:[phone]       OUTPATIENT DAILY NOTE    NAME/AGE/GENDER: Dustin Noble is a 55 y.o. male.     DATE: 02/28/2016    Patient cancelled appointment today due to work conflict.  Will plan to follow up on next scheduled visit.    Danise MinaKristin M Eliu Batch, DPT

## 2016-02-29 ENCOUNTER — Inpatient Hospital Stay: Payer: BLUE CROSS/BLUE SHIELD | Primary: Internal Medicine

## 2016-03-25 NOTE — Telephone Encounter (Signed)
Patient has an appointment on 04/15/16-[facility]

## 2016-03-25 NOTE — Telephone Encounter (Signed)
The patient called and states he is requesting a prescription for Antabuse - the patient states Dr Westly PamLacy knows the reason - patient did ask for appointment to come in regarding the med - we do not have any openings -his pharmacy is [facility] [address] -thanks

## 2016-03-26 MED ORDER — DISULFIRAM 500 MG TAB
500 mg | ORAL_TABLET | ORAL | 0 refills | Status: DC
Start: 2016-03-26 — End: 2016-04-29

## 2016-03-26 NOTE — Telephone Encounter (Signed)
Pt notified that per Dr. Westly PamLacy He can start antabuse 500 QAM and needs apt next available

## 2016-04-09 ENCOUNTER — Encounter: Attending: Internal Medicine | Primary: Internal Medicine

## 2016-04-15 ENCOUNTER — Ambulatory Visit
Admit: 2016-04-15 | Discharge: 2016-04-15 | Payer: BLUE CROSS/BLUE SHIELD | Attending: Internal Medicine | Primary: Internal Medicine

## 2016-04-15 DIAGNOSIS — E782 Mixed hyperlipidemia: Secondary | ICD-10-CM

## 2016-04-15 LAB — CBC WITH AUTOMATED DIFF
ABS. GRANULOCYTES: 5.9 10*3/uL (ref 2.0–7.8)
ABS. LYMPHOCYTES: 1.7 10*3/uL — ABNORMAL LOW (ref 2.0–7.8)
ABS. MONOCYTES: 0.2 10*3/uL (ref 0.10–1.08)
GRANULOCYTES: 76.1 % (ref 37.0–92.0)
HCT: 42.8 % (ref 41.0–54.0)
HGB: 14.7 g/dL (ref 14.0–18.0)
LYMPHOCYTES: 21.5 % (ref 20.5–51.1)
MCH: 33.1 pg (ref 27.0–34.0)
MCHC: 34.4 g/dL (ref 31.0–36.0)
MCV: 96.4 fL (ref 80.0–100.0)
MEAN PLATELET VOLUME: 8.6 fL
MONOCYTES: 2.4 % — ABNORMAL LOW (ref 3.0–10.0)
PLATELET: 222 10*3/uL (ref 140–440)
RBC: 4.44 M/uL (ref 4.40–6.20)
RDW: 12.8 %
WBC: 7.8 10*3/uL (ref 4.0–11.0)

## 2016-04-15 MED ORDER — DIPHTH,PERTUSSIS(ACELL),TETANUS 2.5 LF UNIT-8 MCG-5 LF/0.5 ML IM SUSP
Freq: Once | INTRAMUSCULAR | 0 refills | Status: AC
Start: 2016-04-15 — End: 2016-04-15

## 2016-04-15 NOTE — Progress Notes (Signed)
Lona Millard, M.D.  Internal Medicine  [facility]  [address] [place], [place] [address]  Phone: [phone] Fax: [phone]    HISTORY OF PRESENT ILLNESS  Dustin Noble is a 55 y.o. male.  Cholesterol Problem   The history is provided by the patient. This is a chronic problem. The current episode started more than 1 week ago. The problem occurs constantly. The problem has not changed since onset.Pertinent negatives include no chest pain, no abdominal pain and no shortness of breath. The symptoms are aggravated by eating. Relieved by: TLCs. Treatments tried: TLCs. The treatment provided moderate relief.     Dustin Noble is a Caucasian male who first came to me 07/2012.     1. Elevated BP:   2. HLD (272.4): Controlled with TLCs.   a. FLPs:   i. 07/30/12 Untreated = [193/114/50/145].   ii. 03/22/13 Untreated = [201/136/55/51].   iii. 03/30/14 Untreated = [181/122/49/50].   iv. 04/04/15 Untreated = [179/120/48/56].   v. 04/15/16 Untreated = [  3. Insomnia (780.52): 03/22/13 started on Trazodone which he's taking without problems and with good control of symptoms.   4. Chronic Back Pain: Off Suboxone (Dr. Diamond Nickel).   5. HCM:   ? Colonoscopy: 09/13/08 in [place] = Tics; No polyps; 7-10 Year Repeat Rec.  He is thinking about doing this next year.   ? Prostate:   o PSA's:  1. 07/30/12 = 0.4.  2. 03/30/14 = 0.3.   3. 04/04/15 = 0.3.   4. 04/15/16 =   ? Pneumovax:   ? Td:   ? Flu: Elsewhere 2016.    Current Outpatient Prescriptions   Medication Sig Dispense Refill   ??? disulfiram (ANTABUSE) 500 mg tablet Take 1 Tab by mouth every morning. 30 Tab 0   ??? meloxicam (MOBIC) 7.5 mg tablet Take 1 Tab by mouth daily. 90 Tab 1   ??? cyclobenzaprine (FLEXERIL) 10 mg tablet Take 1 Tab by mouth three (3) times daily as needed for Muscle Spasm(s). 30 Tab 0   ??? montelukast (SINGULAIR) 10 mg tablet Take 1 Tab by mouth daily. 90 Tab 0   ??? fluticasone (FLONASE) 50 mcg/actuation nasal spray 2 Sprays by Both Nostrils route daily. 1 Bottle 5    ??? traZODone (DESYREL) 50 mg tablet TAKE 1 TABLET BY MOUTH NIGHTLY 90 Tab 1     No Known Allergies  Past Medical History:   Diagnosis Date   ??? Anxiety    ??? Chronic low back pain    ??? Insomnia 09/29/2013     No past surgical history on file.  Social History     Social History   ??? Marital status: MARRIED     Spouse name: N/A   ??? Number of children: N/A   ??? Years of education: N/A     Occupational History   ??? Sells vascular stents.  [employer]     Social History Main Topics   ??? Smoking status: Never Smoker   ??? Smokeless tobacco: Never Used   ??? Alcohol use No   ??? Drug use: No   ??? Sexual activity: Not on file     Other Topics Concern   ??? Not on file     Social History Narrative     Family History   Problem Relation Age of Onset   ??? Diabetes Brother      Dm1   ??? Cancer Neg Hx    ??? Heart Disease Neg Hx    ???  Heart Attack Neg Hx      Review of Systems   Respiratory: Negative for cough, sputum production, shortness of breath and wheezing.    Cardiovascular: Negative for chest pain, palpitations, claudication and leg swelling.   Gastrointestinal: Negative for abdominal pain, blood in stool, melena, nausea and vomiting.     Physical Exam   Constitutional: He is oriented to person, place, and time. He appears well-developed and well-nourished. No distress.   HENT:   Head: Normocephalic and atraumatic.   Neck: Normal range of motion. Neck supple. No JVD present. No tracheal deviation present. No thyromegaly present.   Cardiovascular: Normal rate, regular rhythm, normal heart sounds and intact distal pulses.  Exam reveals no gallop and no friction rub.    No murmur heard.  Pulmonary/Chest: Effort normal and breath sounds normal. No stridor. No respiratory distress. He has no wheezes. He has no rales. He exhibits no tenderness.   Abdominal: Soft. Bowel sounds are normal. He exhibits no distension and no mass. There is no tenderness. There is no rebound and no guarding.   Musculoskeletal: He exhibits no edema.    Lymphadenopathy:     He has no cervical adenopathy.   Neurological: He is alert and oriented to person, place, and time.   Skin: Skin is warm and dry. No rash noted. He is not diaphoretic. No erythema. No pallor.   Psychiatric: He has a normal mood and affect. His behavior is normal. Judgment and thought content normal.   Nursing note and vitals reviewed.  I have reviewed/discussed the above normal BMI with the patient.  I have recommended the following interventions: dietary management education, guidance, and counseling .      ASSESSMENT and PLAN    1. Elevated BP: He is asked to monitor his BP at home.   2. HLD (272.4): Well controlled off medications.  Continue TLCs.   3. Insomnia (780.52): Well controlled.  Continue current regimen.   4. Chronic Low Back Pain (724.5): Follow up with Dr. Diamond Nickel as needed.  5. HCM:   6. F/u: 6 Months.  No planned labs at that visit.    Diagnoses and all orders for this visit:    1. Mixed hyperlipidemia  -     BMP  -     CBC  -     Lipid Panel  -     Liver Panel  -     TSH    2. Primary insomnia    3. Chronic bilateral low back pain without sciatica    4. Prostate cancer screening  -     PSA    5. Elevated BP without diagnosis of hypertension  -     TSH    6. Need for hepatitis C screening test  -     HCV AB W/RFLX TO NAA    Other orders  -     Diphth, Pertus,Acell,, Tetanus (BOOSTRIX TDAP) 2.5-8-5 Lf-mcg-Lf/0.11mL susp susp; 0.5 mL by IntraMUSCular route once for 1 dose.

## 2016-04-16 ENCOUNTER — Encounter

## 2016-04-16 LAB — HCV AB W/RFLX TO NAA
HCV AB, 144035: <0.1 s/co ratio (ref 0.0–0.9)
HCV Ab: <0.1 s/co ratio (ref 0.0–0.9)

## 2016-04-16 LAB — METABOLIC PANEL, BASIC
BUN/Creatinine ratio: 14 (ref 9–20)
BUN: 13 mg/dL (ref 6–24)
CO2: 23 mmol/L (ref 18–29)
Calcium: 9.6 mg/dL (ref 8.7–10.2)
Chloride: 101 mmol/L (ref 96–106)
Creatinine: 0.96 mg/dL (ref 0.76–1.27)
GFR est AA: 102 mL/min/{1.73_m2} (ref >59)
GFR est non-AA: 89 mL/min/{1.73_m2} (ref >59)
Glucose: 104 mg/dL — ABNORMAL HIGH (ref 65–99)
Potassium: 4.8 mmol/L (ref 3.5–5.2)
Sodium: 139 mmol/L (ref 134–144)

## 2016-04-16 LAB — LIPID PANEL
Cholesterol, total: 177 mg/dL (ref 100–199)
HDL Cholesterol: 55 mg/dL (ref >39)
LDL, calculated: 93 mg/dL (ref 0–99)
Triglyceride: 143 mg/dL (ref 0–149)
VLDL, calculated: 29 mg/dL (ref 5–40)

## 2016-04-16 LAB — INTERPRETATION

## 2016-04-16 LAB — HEPATIC FUNCTION PANEL
ALT (SGPT): 24 IU/L (ref 0–44)
AST (SGOT): 27 IU/L (ref 0–40)
Albumin: 4.3 g/dL (ref 3.5–5.5)
Alk. phosphatase: 86 IU/L (ref 39–117)
Bilirubin, direct: 0.15 mg/dL (ref 0.00–0.40)
Bilirubin, total: 0.4 mg/dL (ref 0.0–1.2)
Protein, total: 6.6 g/dL (ref 6.0–8.5)

## 2016-04-16 LAB — TSH 3RD GENERATION: TSH: 0.681 u[IU]/mL (ref 0.450–4.500)

## 2016-04-16 LAB — PSA, DIAGNOSTIC (PROSTATE SPECIFIC AG): Prostate Specific Ag: 1.3 ng/mL (ref 0.0–4.0)

## 2016-04-16 NOTE — Addendum Note (Signed)
Addended by: Kem Boroughs on: 04/16/2016 03:00 PM      Modules accepted: Orders

## 2016-04-16 NOTE — Progress Notes (Signed)
Can lab add an A1C for hyperglycemia?

## 2016-04-16 NOTE — Progress Notes (Signed)
Lab will add A1C

## 2016-04-17 LAB — HEMOGLOBIN A1C W/O EAG: Hemoglobin A1c: 5.1 % (ref 4.8–5.6)

## 2016-04-29 MED ORDER — DISULFIRAM 500 MG TAB
500 mg | ORAL_TABLET | ORAL | 5 refills | Status: DC
Start: 2016-04-29 — End: 2016-10-16

## 2016-04-29 NOTE — Telephone Encounter (Signed)
Pt is currently up-to-date with appts. Next appt is Feb 2018. Med refill request sent to the pharmacy per Dr. Westly PamLacy

## 2016-05-01 ENCOUNTER — Encounter: Attending: Internal Medicine | Primary: Internal Medicine

## 2016-05-07 ENCOUNTER — Ambulatory Visit
Admit: 2016-05-07 | Discharge: 2016-05-07 | Payer: BLUE CROSS/BLUE SHIELD | Attending: Internal Medicine | Primary: Internal Medicine

## 2016-05-07 DIAGNOSIS — F5101 Primary insomnia: Secondary | ICD-10-CM

## 2016-05-07 MED ORDER — HYDROXYZINE 10 MG TAB
10 mg | ORAL_TABLET | Freq: Three times a day (TID) | ORAL | 1 refills | Status: DC | PRN
Start: 2016-05-07 — End: 2016-08-10

## 2016-05-07 MED ORDER — ESCITALOPRAM 10 MG TAB
10 mg | ORAL_TABLET | Freq: Every day | ORAL | 1 refills | Status: DC
Start: 2016-05-07 — End: 2016-10-16

## 2016-05-07 NOTE — Progress Notes (Signed)
Dustin MillardJohn E. Zaela Noble, M.D.  Internal Medicine  [facility]  [address] [place], [place] [id number]   Phone: [phone]  Fax: [phone]    HISTORY OF PRESENT ILLNESS  Dustin BunchChristopher A Noble is a 55 y.o. male.  Depression   The history is provided by the patient. This is a chronic problem. The current episode started more than 1 week ago. The problem occurs constantly. The problem has not changed since onset.Pertinent negatives include no chest pain, no abdominal pain and no shortness of breath. The symptoms are aggravated by stress. Nothing relieves the symptoms. Treatments tried: Paxil and Celexa in the past with mixed results. The treatment provided mild relief.     Current Outpatient Prescriptions   Medication Sig Dispense Refill   ??? escitalopram oxalate (LEXAPRO) 10 mg tablet Take 1 Tab by mouth daily. 90 Tab 1   ??? hydrOXYzine HCl (ATARAX) 10 mg tablet Take 1 Tab by mouth three (3) times daily as needed for Itching for up to 10 days. 90 Tab 1   ??? disulfiram (ANTABUSE) 500 mg tablet Take 1 Tab by mouth every morning. 30 Tab 5   ??? meloxicam (MOBIC) 7.5 mg tablet Take 1 Tab by mouth daily. 90 Tab 1   ??? cyclobenzaprine (FLEXERIL) 10 mg tablet Take 1 Tab by mouth three (3) times daily as needed for Muscle Spasm(s). 30 Tab 0   ??? montelukast (SINGULAIR) 10 mg tablet Take 1 Tab by mouth daily. 90 Tab 0   ??? fluticasone (FLONASE) 50 mcg/actuation nasal spray 2 Sprays by Both Nostrils route daily. 1 Bottle 5     No Known Allergies  Past Medical History:   Diagnosis Date   ??? Anxiety    ??? Chronic low back pain    ??? Insomnia 09/29/2013     No past surgical history on file.  Social History     Social History   ??? Marital status: MARRIED     Spouse name: N/A   ??? Number of children: N/A   ??? Years of education: N/A     Occupational History   ??? Sells vascular stents.  [employer]     Social History Main Topics   ??? Smoking status: Never Smoker   ??? Smokeless tobacco: Never Used   ??? Alcohol use No   ??? Drug use: No    ??? Sexual activity: Not on file     Other Topics Concern   ??? Not on file     Social History Narrative     Family History   Problem Relation Age of Onset   ??? Diabetes Brother      Dm1   ??? Cancer Neg Hx    ??? Heart Disease Neg Hx    ??? Heart Attack Neg Hx        Review of Systems   Respiratory: Negative for cough and shortness of breath.    Cardiovascular: Negative for chest pain and leg swelling.   Gastrointestinal: Negative for abdominal pain, blood in stool and melena.   Psychiatric/Behavioral: Positive for depression. Negative for suicidal ideas. The patient has insomnia.        Physical Exam   Constitutional: He is oriented to person, place, and time. He appears well-developed and well-nourished. No distress.   HENT:   Head: Normocephalic and atraumatic.   Neck: Neck supple. No JVD present. No tracheal deviation present. No thyromegaly present.   Cardiovascular: Normal rate, regular rhythm, normal heart sounds and intact distal pulses.  Exam  reveals no gallop and no friction rub.    No murmur heard.  Pulmonary/Chest: Effort normal and breath sounds normal. No respiratory distress. He has no wheezes. He has no rales. He exhibits no tenderness.   Abdominal: Soft. Bowel sounds are normal. He exhibits no distension and no mass. There is no tenderness. There is no rebound and no guarding.   Musculoskeletal: He exhibits no edema.   Lymphadenopathy:     He has no cervical adenopathy.   Neurological: He is alert and oriented to person, place, and time.   Skin: Skin is warm and dry. No rash noted. He is not diaphoretic.   Psychiatric: He has a normal mood and affect. His behavior is normal. Judgment and thought content normal.   Nursing note and vitals reviewed.      ASSESSMENT and PLAN  Diagnoses and all orders for this visit:    1. Primary insomnia  -     hydrOXYzine HCl (ATARAX) 10 mg tablet; Take 1 Tab by mouth three (3) times daily as needed for Itching for up to 10 days.    2. Depression, unspecified depression type   -     escitalopram oxalate (LEXAPRO) 10 mg tablet; Take 1 Tab by mouth daily.    Symptomatic measures discussed.  Patient instructed to call Dustin Noble immediately if symptoms worse, change, or do not improve.  He is warned of the risk of suicidally when starting anti-depressants and told to seek medical care immediately if these develop.

## 2016-05-10 ENCOUNTER — Encounter

## 2016-05-12 MED ORDER — MONTELUKAST 10 MG TAB
10 mg | ORAL_TABLET | ORAL | 1 refills | Status: DC
Start: 2016-05-12 — End: 2018-12-23

## 2016-05-12 NOTE — Telephone Encounter (Signed)
Pt is currently up-to-date with appts. Next appt is Feb 2018. Med refill request sent to the pharmacy per Dr. Westly Pam

## 2016-05-21 DIAGNOSIS — F419 Anxiety disorder, unspecified: Secondary | ICD-10-CM

## 2016-05-22 ENCOUNTER — Inpatient Hospital Stay
Admit: 2016-05-22 | Discharge: 2016-05-22 | Disposition: A | Discharge status: Home / Self Care | Payer: BLUE CROSS/BLUE SHIELD | Attending: Emergency Medicine

## 2016-05-22 LAB — CBC WITH AUTOMATED DIFF
ABS. BASOPHILS: 0 10*3/uL (ref 0.0–0.2)
ABS. EOSINOPHILS: 0.2 10*3/uL (ref 0.0–0.8)
ABS. IMM. GRANS.: 0 10*3/uL (ref 0.0–0.5)
ABS. LYMPHOCYTES: 3.3 10*3/uL (ref 0.5–4.6)
ABS. MONOCYTES: 0.3 10*3/uL (ref 0.1–1.3)
ABS. NEUTROPHILS: 6.2 10*3/uL (ref 1.7–8.2)
BASOPHILS: 0 % (ref 0.0–2.0)
EOSINOPHILS: 2 % (ref 0.5–7.8)
HCT: 44.8 % (ref 41.1–50.3)
HGB: 15.6 g/dL (ref 13.6–17.2)
IMMATURE GRANULOCYTES: 0.3 % (ref 0.0–5.0)
LYMPHOCYTES: 33 % (ref 13–44)
MCH: 32.1 PG (ref 26.1–32.9)
MCHC: 34.8 g/dL (ref 31.4–35.0)
MCV: 92.2 FL (ref 79.6–97.8)
MONOCYTES: 3 % — ABNORMAL LOW (ref 4.0–12.0)
MPV: 11 FL (ref 10.8–14.1)
NEUTROPHILS: 62 % (ref 43–78)
PLATELET: 243 10*3/uL (ref 150–450)
RBC: 4.86 M/uL (ref 4.23–5.67)
RDW: 12.6 % (ref 11.9–14.6)
WBC: 9.9 10*3/uL (ref 4.3–11.1)

## 2016-05-22 LAB — METABOLIC PANEL, BASIC
Anion gap: 7 mmol/L (ref 7–16)
BUN: 19 MG/DL (ref 6–23)
CO2: 26 mmol/L (ref 21–32)
Calcium: 8.5 MG/DL (ref 8.3–10.4)
Chloride: 105 mmol/L (ref 98–107)
Creatinine: 1.21 MG/DL (ref 0.8–1.5)
GFR est AA: >60 mL/min/{1.73_m2} (ref >60)
GFR est non-AA: >60 mL/min/{1.73_m2} (ref >60)
Glucose: 103 mg/dL — ABNORMAL HIGH (ref 65–100)
Potassium: 4.1 mmol/L (ref 3.5–5.1)
Sodium: 138 mmol/L (ref 136–145)

## 2016-05-22 MED ORDER — EPINEPHRINE 0.3 MG/0.3 ML IM PEN INJECTOR
0.3 mg/ mL | INJECTION | Freq: Once | INTRAMUSCULAR | 0 refills | Status: DC | PRN
Start: 2016-05-22 — End: 2018-12-23

## 2016-05-22 MED ORDER — DIPHENHYDRAMINE HCL 50 MG/ML IJ SOLN
50 mg/mL | INTRAMUSCULAR | Status: AC
Start: 2016-05-22 — End: 2016-05-21
  Administered 2016-05-22: 03:00:00 via INTRAVENOUS

## 2016-05-22 MED ORDER — METHYLPREDNISOLONE (PF) 125 MG/2 ML IJ SOLR
125 mg/2 mL | INTRAMUSCULAR | Status: AC
Start: 2016-05-22 — End: 2016-05-21
  Administered 2016-05-22: 03:00:00 via INTRAVENOUS

## 2016-05-22 MED ORDER — METHYLPREDNISOLONE 4 MG TABS IN A DOSE PACK
4 mg | ORAL | 0 refills | Status: DC
Start: 2016-05-22 — End: 2016-10-16

## 2016-05-22 MED ORDER — FAMOTIDINE (PF) 20 MG/2 ML IV
20 mg/2 mL | INTRAVENOUS | Status: AC
Start: 2016-05-22 — End: 2016-05-21
  Administered 2016-05-22: 03:00:00 via INTRAVENOUS

## 2016-05-22 MED ORDER — EPINEPHRINE (PF) 1 MG/ML INJECTION
1 mg/mL ( mL) | INTRAMUSCULAR | Status: DC
Start: 2016-05-22 — End: 2016-05-22

## 2016-05-22 MED ORDER — SODIUM CHLORIDE 0.9% BOLUS IV
0.9 % | Freq: Once | INTRAVENOUS | Status: AC
Start: 2016-05-22 — End: 2016-05-22
  Administered 2016-05-22: 03:00:00 via INTRAVENOUS

## 2016-05-22 MED FILL — FAMOTIDINE (PF) 20 MG/2 ML IV: 20 mg/2 mL | INTRAVENOUS | Qty: 2

## 2016-05-22 MED FILL — EPINEPHRINE (PF) 1 MG/ML INJECTION: 1 mg/mL ( mL) | INTRAMUSCULAR | Qty: 1

## 2016-05-22 MED FILL — SOLU-MEDROL (PF) 125 MG/2 ML SOLUTION FOR INJECTION: 125 mg/2 mL | INTRAMUSCULAR | Qty: 2

## 2016-05-22 MED FILL — DIPHENHYDRAMINE HCL 50 MG/ML IJ SOLN: 50 mg/mL | INTRAMUSCULAR | Qty: 1

## 2016-05-22 NOTE — ED Notes (Signed)
I have reviewed discharge instructions with the patient.  The patient verbalized understanding.

## 2016-05-22 NOTE — ED Provider Notes (Signed)
HPI Comments: History provided by patient and spouse.  Patient had onset of allergic type reaction this evening at home.  Not certain as to provoke her.  Several hours earlier he had eaten a white chili dish.  Only thing it eaten in the interval closer to the onset was a malted milk chocolate.he one point had trouble with shrimp/shellfish.interestingly he is eating shrimp since that time.  He had no shrimp or shellfish tonight.  He is on no new medications.  No history of similar the past.  Had flushing across his face and some swelling to upper and lower lips.  No significant tongue swelling.  No difficulty handling secretions.  No wheezing.  No generalized hives or skin rash noted.  He took 2 over-the-counter Benadryls before coming into our department.    Patient is a 55 y.o. male presenting with allergic reaction. The history is provided by the patient and the spouse.   Allergic Reaction    This is a new problem. The current episode started less than 1 hour ago. The problem has not changed since onset.Pertinent negatives include no shortness of breath. There were no other medications available.        Past Medical History:   Diagnosis Date   ??? Anxiety    ??? Chronic low back pain    ??? Insomnia 09/29/2013       No past surgical history on file.      Family History:   Problem Relation Age of Onset   ??? Diabetes Brother      Dm1   ??? Cancer Neg Hx    ??? Heart Disease Neg Hx    ??? Heart Attack Neg Hx        Social History     Social History   ??? Marital status: MARRIED     Spouse name: N/A   ??? Number of children: N/A   ??? Years of education: N/A     Occupational History   ??? Sells vascular stents.  [employer]     Social History Main Topics   ??? Smoking status: Never Smoker   ??? Smokeless tobacco: Never Used   ??? Alcohol use No   ??? Drug use: No   ??? Sexual activity: Not on file     Other Topics Concern   ??? Not on file     Social History Narrative         ALLERGIES: Shellfish derived    Review of Systems    Constitutional: Negative for chills.   Respiratory: Negative for shortness of breath and wheezing.    Skin: Positive for color change. Negative for rash.   All other systems reviewed and are negative.      Vitals:    05/22/16 0015 05/22/16 0030 05/22/16 0100 05/22/16 0116   BP: 127/82 132/86 125/86    Pulse: 68 68 75    Resp: 18  22    Temp:       SpO2: 97% 96% 96% 100%   Weight:       Height:                Physical Exam   Constitutional: He appears well-developed and well-nourished. No distress.   HENT:   Head: Atraumatic.   Mouth/Throat: Oropharynx is clear and moist. No oropharyngeal exudate.   We patent tongue midline and no significant swelling does have mild swelling to upper and lower lips and flushing across his mid face   Eyes: No scleral  icterus.   Neck: Neck supple.   Cardiovascular: Normal rate.    Pulmonary/Chest: Effort normal. No respiratory distress. He has no wheezes.   Abdominal: Soft. There is no tenderness.   Musculoskeletal: He exhibits no edema.   Neurological: He is alert. He exhibits normal muscle tone. Coordination normal.   Skin: Skin is warm and dry.   Psychiatric: Thought content normal.   Nursing note and vitals reviewed.       MDM  Number of Diagnoses or Management Options  Acute allergic reaction, initial encounter:   Diagnosis management comments: No definite provoker , no ACE/ no family hx. In past with shellfish allergy but none consumed tonight.        Amount and/or Complexity of Data Reviewed  Obtain history from someone other than the patient: yes    Risk of Complications, Morbidity, and/or Mortality  Presenting problems: moderate  Management options: moderate    Patient Progress  Patient progress: improved    ED Course       Procedures

## 2016-08-10 ENCOUNTER — Encounter

## 2016-08-11 MED ORDER — HYDROXYZINE 10 MG TAB
10 mg | ORAL_TABLET | Freq: Three times a day (TID) | ORAL | 3 refills | Status: DC | PRN
Start: 2016-08-11 — End: 2018-12-23

## 2016-08-11 NOTE — Telephone Encounter (Signed)
Pt is currently up-to-date with appts. Next appt is February 2018. Med refill request sent to the pharmacy per Dr. Lacy

## 2016-09-06 ENCOUNTER — Encounter

## 2016-09-17 MED ORDER — TRAZODONE 50 MG TAB
50 mg | ORAL_TABLET | ORAL | 1 refills | Status: DC
Start: 2016-09-17 — End: 2016-12-23

## 2016-09-17 NOTE — Telephone Encounter (Signed)
Pt is currently up-to-date with appts. Next appt is February 2018. Med refill request sent to the pharmacy per Dr. Lacy

## 2016-10-14 ENCOUNTER — Institutional Professional Consult (permissible substitution): Admit: 2016-10-14 | Discharge: 2016-10-14 | Payer: BLUE CROSS/BLUE SHIELD | Primary: Internal Medicine

## 2016-10-14 DIAGNOSIS — Z111 Encounter for screening for respiratory tuberculosis: Secondary | ICD-10-CM

## 2016-10-14 NOTE — Progress Notes (Signed)
PPD Placed in the left forearm.

## 2016-10-16 ENCOUNTER — Ambulatory Visit
Admit: 2016-10-16 | Discharge: 2016-10-16 | Payer: BLUE CROSS/BLUE SHIELD | Attending: Internal Medicine | Primary: Internal Medicine

## 2016-10-16 DIAGNOSIS — I10 Essential (primary) hypertension: Secondary | ICD-10-CM

## 2016-10-16 LAB — AMB POC TUBERCULOSIS, INTRADERMAL (SKIN TEST)
PPD: 0 Negative
mm Induration: 0 mm

## 2016-10-16 MED ORDER — SERTRALINE 50 MG TAB
50 mg | ORAL_TABLET | Freq: Every day | ORAL | 1 refills | Status: DC
Start: 2016-10-16 — End: 2018-12-23

## 2016-10-16 MED ORDER — HYDROCHLOROTHIAZIDE 12.5 MG TAB
12.5 mg | ORAL_TABLET | Freq: Every day | ORAL | 1 refills | Status: DC
Start: 2016-10-16 — End: 2017-01-16

## 2016-10-16 MED ORDER — NALTREXONE 50 MG TAB
50 mg | ORAL_TABLET | Freq: Every day | ORAL | 5 refills | Status: DC
Start: 2016-10-16 — End: 2018-12-23

## 2016-10-16 NOTE — Progress Notes (Signed)
Lona MillardJohn E. Bilbo Carcamo, M.D.  Internal Medicine  [facility]  [address] [place], [place] [id number]  Phone: [phone] Fax: [phone]    HISTORY OF PRESENT ILLNESS  Eudelia BunchChristopher A Moler is a 56 y.o. male.  Cholesterol Problem   The history is provided by the patient. This is a chronic problem. The current episode started more than 1 week ago. The problem occurs constantly. The problem has not changed since onset.Pertinent negatives include no chest pain, no abdominal pain and no shortness of breath. The symptoms are aggravated by eating. Relieved by: TLCs. Treatments tried: TLCs. The treatment provided moderate relief.     [name] is a Caucasian male who first came to me 07/2012.     1. HTN: Home BPs = Not being checked.   2. HLD (272.4): Controlled with TLCs.   a. FLPs:   i. 07/30/12 Untreated = [193/114/50/145].   ii. 03/22/13 Untreated = [201/136/55/51].   iii. 03/30/14 Untreated = [181/122/49/50].   iv. 04/04/15 Untreated = [179/120/48/56].   v. 04/15/16 Untreated = [177/93/55/143].   3. Depression (311):   4. Alcohol Abuse:   5. Chronic Back Pain: Off Suboxone (Dr. Diamond NickelSherbondy).   6. HCM:   ? Colonoscopy: 09/13/08 in [place] = Tics; No polyps; 7-10 Year Repeat Rec.  He is thinking about doing this next year.   ? Prostate:   o PSA's:  1. 07/30/12 = 0.4.  2. 03/30/14 = 0.3.   3. 04/04/15 = 0.3.   4. 04/15/16 = 1.3   ? Pneumovax:   ? Td: Rx given 04/2016.   ? Flu: Elsewhere 2017.    Current Outpatient Prescriptions   Medication Sig Dispense Refill   ??? traZODone (DESYREL) 50 mg tablet TAKE 1 TABLET BY MOUTH EVERY NIGHT 90 Tab 1   ??? EPINEPHrine (EPIPEN) 0.3 mg/0.3 mL injection 0.3 mL by IntraMUSCular route once as needed for up to 1 dose. 1 Syringe 0   ??? montelukast (SINGULAIR) 10 mg tablet TAKE 1 TABLET BY MOUTH DAILY 90 Tab 1   ??? cyclobenzaprine (FLEXERIL) 10 mg tablet Take 1 Tab by mouth three (3) times daily as needed for Muscle Spasm(s). 30 Tab 0   ??? fluticasone (FLONASE) 50 mcg/actuation nasal spray 2 Sprays by Both  Nostrils route daily. 1 Bottle 5   ??? hydrOXYzine HCl (ATARAX) 10 mg tablet Take 1 Tab by mouth three (3) times daily as needed for Itching. 90 Tab 3     Allergies   Allergen Reactions   ??? Shellfish Derived Anaphylaxis     Past Medical History:   Diagnosis Date   ??? Anxiety    ??? Chronic low back pain    ??? Insomnia 09/29/2013     No past surgical history on file.  Social History     Social History   ??? Marital status: MARRIED     Spouse name: N/A   ??? Number of children: N/A   ??? Years of education: N/A     Occupational History   ??? Sells vascular stents.  [employer]     Social History Main Topics   ??? Smoking status: Never Smoker   ??? Smokeless tobacco: Never Used   ??? Alcohol use No   ??? Drug use: No   ??? Sexual activity: Not on file     Other Topics Concern   ??? Not on file     Social History Narrative     Family History   Problem Relation Age of Onset   ???  Diabetes Brother      Dm1   ??? Cancer Neg Hx    ??? Heart Disease Neg Hx    ??? Heart Attack Neg Hx      Review of Systems   Respiratory: Negative for cough, sputum production, shortness of breath and wheezing.    Cardiovascular: Negative for chest pain, palpitations, claudication and leg swelling.   Gastrointestinal: Negative for abdominal pain, blood in stool, melena, nausea and vomiting.     Physical Exam   Constitutional: He is oriented to person, place, and time. He appears well-developed and well-nourished. No distress.   HENT:   Head: Normocephalic and atraumatic.   Neck: Normal range of motion. Neck supple. No JVD present. No tracheal deviation present. No thyromegaly present.   Cardiovascular: Normal rate, regular rhythm, normal heart sounds and intact distal pulses.  Exam reveals no gallop and no friction rub.    No murmur heard.  Pulmonary/Chest: Effort normal and breath sounds normal. No stridor. No respiratory distress. He has no wheezes. He has no rales. He exhibits no tenderness.   Abdominal: Soft. Bowel sounds are normal. He exhibits no distension and no  mass. There is no tenderness. There is no rebound and no guarding.   Musculoskeletal: He exhibits no edema.   Lymphadenopathy:     He has no cervical adenopathy.   Neurological: He is alert and oriented to person, place, and time.   Skin: Skin is warm and dry. No rash noted. He is not diaphoretic. No erythema. No pallor.   Psychiatric: He has a normal mood and affect. His behavior is normal. Judgment and thought content normal.   Nursing note and vitals reviewed.  I have reviewed/discussed the above normal BMI with the patient.  I have recommended the following interventions: dietary management education, guidance, and counseling .      ASSESSMENT and PLAN    1. HTN: Start on HCTZ.   2. HLD (272.4): Well controlled off medications.  Continue TLCs.   3. Chronic Depression: Not well controlled.  Continue current regimen and add Zoloft.   4. Alcohol Abuse: He asks to go on Naltrexone.   5. Chronic Low Back Pain (724.5): Follow up with Dr. Diamond Nickel as needed.  6. HCM:   7. F/u: 3 Months.  No planned labs at that visit.    Diagnoses and all orders for this visit:    1. Essential hypertension  -     hydroCHLOROthiazide (HYDRODIURIL) 12.5 mg tablet; Take 1 Tab by mouth daily.    2. Mixed hyperlipidemia    3. Chronic depression  -     sertraline (ZOLOFT) 50 mg tablet; Take 1 Tab by mouth daily.    4. Alcohol abuse  -     naltrexone (DEPADE) 50 mg tablet; Take 1 Tab by mouth daily.

## 2016-10-16 NOTE — Patient Instructions (Signed)
Check BP 1-2 times per week.  Call our office if BP runs above 140/80.

## 2016-12-12 ENCOUNTER — Encounter

## 2016-12-23 ENCOUNTER — Encounter

## 2016-12-23 MED ORDER — TRAZODONE 50 MG TAB
50 mg | ORAL_TABLET | ORAL | 1 refills | Status: DC
Start: 2016-12-23 — End: 2017-05-17

## 2016-12-23 NOTE — Telephone Encounter (Signed)
Has an appointment 01/13/17 with Dr. Westly Pam want's a refill on Trazodone sent to [facility] today; SH/MA

## 2017-01-13 ENCOUNTER — Encounter: Attending: Internal Medicine | Primary: Internal Medicine

## 2017-01-16 ENCOUNTER — Encounter

## 2017-01-16 ENCOUNTER — Ambulatory Visit
Admit: 2017-01-16 | Discharge: 2017-01-16 | Payer: BLUE CROSS/BLUE SHIELD | Attending: Internal Medicine | Primary: Internal Medicine

## 2017-01-16 DIAGNOSIS — I10 Essential (primary) hypertension: Secondary | ICD-10-CM

## 2017-01-16 MED ORDER — LOSARTAN-HYDROCHLOROTHIAZIDE 50 MG-12.5 MG TAB
ORAL_TABLET | Freq: Every day | ORAL | 1 refills | Status: DC
Start: 2017-01-16 — End: 2017-07-21

## 2017-01-16 NOTE — Progress Notes (Signed)
Lona MillardJohn E. Jerry Clyne, M.D.  Internal Medicine  [facility]  [address] [place], [place] [id number]  Phone: [phone] Fax: [phone]    HISTORY OF PRESENT ILLNESS  Eudelia BunchChristopher A Authier is a 56 y.o. male.  Hypertension    The history is provided by the patient. This is a chronic problem. The current episode started more than 1 week ago. The problem has not changed since onset.Pertinent negatives include no chest pain and no shortness of breath. There are no associated agents to hypertension. Risk factors include male gender, hypertension and dyslipidemia.     [name] is a Caucasian male who first came to me 07/2012.     1. HTN: Taking medications w/o problems.  Current regimen = HCTZ 12.5.  Home BPs = Not being checked.   2. HLD (272.4): Controlled with TLCs.   a. FLPs:   i. 07/30/12 Untreated = [193/114/50/145].   ii. 03/22/13 Untreated = [201/136/55/51].   iii. 03/30/14 Untreated = [181/122/49/50].   iv. 04/04/15 Untreated = [179/120/48/56].   v. 04/15/16 Untreated = [177/93/55/143].   3. Depression (311): Follows with Psychiatry.  Taking Zoloft without problems and with good control of symptoms.   4. Alcohol Abuse: Follows with Psychiatry.  Taking Naltrexone without problems and with good control of symptoms.   5. Chronic Back Pain: Off Suboxone (Dr. Diamond NickelSherbondy).   6. HCM:   ? Colonoscopy: 09/13/08 in [place] = Tics; No polyps; 7-10 Year Repeat Rec.   ? Prostate:   o PSA's:  1. 07/30/12 = 0.4.  2. 03/30/14 = 0.3.   3. 04/04/15 = 0.3.   4. 04/15/16 = 1.3.   ? Pneumovax:   ? Td: Rx given 04/2016.   ? Flu: Elsewhere 2017.    Current Outpatient Prescriptions   Medication Sig Dispense Refill   ??? traZODone (DESYREL) 50 mg tablet TAKE 1 TABLET BY MOUTH EVERY NIGHT 90 Tab 1   ??? naltrexone (DEPADE) 50 mg tablet Take 1 Tab by mouth daily. 30 Tab 5   ??? sertraline (ZOLOFT) 50 mg tablet Take 1 Tab by mouth daily. 90 Tab 1   ??? hydroCHLOROthiazide (HYDRODIURIL) 12.5 mg tablet Take 1 Tab by mouth daily. 90 Tab 1    ??? hydrOXYzine HCl (ATARAX) 10 mg tablet Take 1 Tab by mouth three (3) times daily as needed for Itching. 90 Tab 3   ??? EPINEPHrine (EPIPEN) 0.3 mg/0.3 mL injection 0.3 mL by IntraMUSCular route once as needed for up to 1 dose. 1 Syringe 0   ??? fluticasone (FLONASE) 50 mcg/actuation nasal spray 2 Sprays by Both Nostrils route daily. 1 Bottle 5   ??? montelukast (SINGULAIR) 10 mg tablet TAKE 1 TABLET BY MOUTH DAILY 90 Tab 1   ??? cyclobenzaprine (FLEXERIL) 10 mg tablet Take 1 Tab by mouth three (3) times daily as needed for Muscle Spasm(s). 30 Tab 0     Allergies   Allergen Reactions   ??? Shellfish Derived Anaphylaxis     Past Medical History:   Diagnosis Date   ??? Alcohol abuse 10/16/2016   ??? Anxiety    ??? Chronic depression 10/16/2016   ??? Chronic low back pain    ??? Essential hypertension 10/16/2016   ??? Insomnia 09/29/2013     No past surgical history on file.  Social History     Social History   ??? Marital status: MARRIED     Spouse name: N/A   ??? Number of children: N/A   ??? Years of education: N/A  Occupational History   ??? Sells vascular stents.  [employer]     Social History Main Topics   ??? Smoking status: Never Smoker   ??? Smokeless tobacco: Never Used   ??? Alcohol use No   ??? Drug use: No   ??? Sexual activity: Not on file     Other Topics Concern   ??? Not on file     Social History Narrative     Family History   Problem Relation Age of Onset   ??? Diabetes Brother      Dm1   ??? Cancer Neg Hx    ??? Heart Disease Neg Hx    ??? Heart Attack Neg Hx      Review of Systems   Respiratory: Negative for cough and shortness of breath.    Cardiovascular: Negative for chest pain and leg swelling.   Gastrointestinal: Negative for abdominal pain, blood in stool and melena.     Physical Exam   Constitutional: He is oriented to person, place, and time. He appears well-developed and well-nourished. No distress.   HENT:   Head: Normocephalic and atraumatic.   Cardiovascular: Normal rate, regular rhythm, normal heart sounds and  intact distal pulses.  Exam reveals no gallop and no friction rub.    No murmur heard.  Pulmonary/Chest: Effort normal and breath sounds normal. No respiratory distress. He has no wheezes. He has no rales. He exhibits no tenderness.   Abdominal: Soft. Bowel sounds are normal. He exhibits no distension and no mass. There is no tenderness. There is no rebound and no guarding.   Musculoskeletal: He exhibits no edema.   Neurological: He is alert and oriented to person, place, and time.   Skin: Skin is warm and dry. No rash noted. He is not diaphoretic. No erythema. No pallor.   Psychiatric: He has a normal mood and affect. His behavior is normal. Judgment and thought content normal.   Nursing note and vitals reviewed.  I have reviewed/discussed the above normal BMI with the patient.  I have recommended the following interventions: dietary management education, guidance, and counseling .      ASSESSMENT and PLAN    1. HTN: Not well controlled.  Add Losartan and recheck BMP in 2 weeks.  He is asked to monitor his BP at home, call me if it runs above 140/80, and bring in a log next time.   2. HLD (272.4): Well controlled off medications.  Continue TLCs.   3. Chronic Depression: Well controlled.  Follow up with Psychiatry.   4. Alcohol Abuse: Well controlled.  Follow up with Psychiatry.   5. Chronic Low Back Pain (724.5): Follow up with Dr. Diamond Nickel as needed.  6. HCM:   7. F/u: 6 Months.  Fasting labs at that visit.    Diagnoses and all orders for this visit:    1. Essential hypertension  -     losartan-hydroCHLOROthiazide (HYZAAR) 50-12.5 mg per tablet; Take 1 Tab by mouth daily.  -     BMP; Future    2. Mixed hyperlipidemia    3. Primary insomnia    4. Chronic depression

## 2017-01-16 NOTE — Patient Instructions (Signed)
Check BP 1-2 times per week.  Call our office if BP runs above 140/80.

## 2017-01-30 ENCOUNTER — Encounter: Primary: Internal Medicine

## 2017-02-05 ENCOUNTER — Other Ambulatory Visit: Admit: 2017-02-05 | Discharge: 2017-02-05 | Payer: BLUE CROSS/BLUE SHIELD | Primary: Internal Medicine

## 2017-02-05 DIAGNOSIS — I10 Essential (primary) hypertension: Secondary | ICD-10-CM

## 2017-02-06 LAB — METABOLIC PANEL, BASIC
BUN/Creatinine ratio: 18 (ref 9–20)
BUN: 16 mg/dL (ref 6–24)
CO2: 23 mmol/L (ref 18–29)
Calcium: 9.4 mg/dL (ref 8.7–10.2)
Chloride: 102 mmol/L (ref 96–106)
Creatinine: 0.91 mg/dL (ref 0.76–1.27)
GFR est AA: 109 mL/min/{1.73_m2} (ref >59)
GFR est non-AA: 94 mL/min/{1.73_m2} (ref >59)
Glucose: 84 mg/dL (ref 65–99)
Potassium: 3.8 mmol/L (ref 3.5–5.2)
Sodium: 140 mmol/L (ref 134–144)

## 2017-05-17 ENCOUNTER — Encounter

## 2017-05-19 MED ORDER — TRAZODONE 50 MG TAB
50 mg | ORAL_TABLET | ORAL | 1 refills | Status: DC
Start: 2017-05-19 — End: 2018-12-23

## 2017-05-19 NOTE — Telephone Encounter (Signed)
Pt is currently up-to-date with appts. Next appt is November 2018. Med refill request sent to the pharmacy per Dr. Lacy

## 2017-06-15 ENCOUNTER — Encounter

## 2017-07-20 ENCOUNTER — Encounter: Payer: BLUE CROSS/BLUE SHIELD | Attending: Internal Medicine | Primary: Internal Medicine

## 2017-07-21 ENCOUNTER — Encounter

## 2017-07-22 MED ORDER — LOSARTAN-HYDROCHLOROTHIAZIDE 50 MG-12.5 MG TAB
ORAL_TABLET | ORAL | 0 refills | Status: DC
Start: 2017-07-22 — End: 2018-06-11

## 2017-07-22 NOTE — Telephone Encounter (Signed)
Pt was given a 90 day supply. He will need an appt for further refills

## 2017-10-15 ENCOUNTER — Encounter

## 2017-11-11 ENCOUNTER — Encounter

## 2017-11-23 ENCOUNTER — Encounter

## 2018-06-11 ENCOUNTER — Ambulatory Visit
Admit: 2018-06-11 | Discharge: 2018-06-11 | Payer: BLUE CROSS/BLUE SHIELD | Attending: Internal Medicine | Primary: Internal Medicine

## 2018-06-11 ENCOUNTER — Ambulatory Visit: Attending: Internal Medicine | Primary: Internal Medicine

## 2018-06-11 DIAGNOSIS — Z0000 Encounter for general adult medical examination without abnormal findings: Secondary | ICD-10-CM

## 2018-06-11 MED ORDER — LOSARTAN-HYDROCHLOROTHIAZIDE 50 MG-12.5 MG TAB
ORAL_TABLET | Freq: Every day | ORAL | 0 refills | Status: DC
Start: 2018-06-11 — End: 2018-12-23

## 2018-06-11 NOTE — Progress Notes (Signed)
Lona Millard, M.D.  Internal Medicine  [facility]  [address] [place], [place] [address]  Phone: [phone]  Fax: [phone]    HISTORY OF PRESENT ILLNESS  Dustin Noble is a 57 y.o. male.  HPI     Mr. Mapel is a Caucasian male who first came to me 07/2012.  He is here for a physical and is feeling/doing well.  He has not been compliant with recommended f/u and has not been here in almost a year and a half.     1. HTN: Taking medications w/o problems.  Current regimen = Losartan-HCTZ 50-12.5 (Non-compliant with this).  Home BPs = Not being checked.   2. HLD (272.4): Controlled with TLCs.   a. FLPs:   i. 07/30/12 Untreated = [193/114/50/145].   ii. 03/22/13 Untreated = [201/136/55/51].   iii. 03/30/14 Untreated = [181/122/49/50].   iv. 04/04/15 Untreated = [179/120/48/56].   v. 04/15/16 Untreated = [177/93/55/143].   vi. 06/11/18 Untreated = [  3. Depression (311): Follows with Psychiatry.  Taking Zoloft without problems and with good control of symptoms.   4. HCM:   ? Colonoscopy: 09/13/08 in [place] = Tics; No polyps; 7-10 Year Repeat Rec.   ? Prostate:   o PSA's:  1. 07/30/12 = 0.4.  2. 03/30/14 = 0.3.   3. 04/04/15 = 0.3.   4. 04/15/16 = 1.3.   5. 06/11/18 =   ? Pneumovax:   ? Td: Per him elsewhere 05/2016.   ? Flu: 06/11/18.    Current Outpatient Medications   Medication Sig Dispense Refill   ??? losartan-hydroCHLOROthiazide (HYZAAR) 50-12.5 mg per tablet TAKE 1 TABLET BY MOUTH DAILY 90 Tab 0   ??? traZODone (DESYREL) 50 mg tablet TAKE 1 TABLET BY MOUTH EVERY NIGHT 90 Tab 1   ??? naltrexone (DEPADE) 50 mg tablet Take 1 Tab by mouth daily. 30 Tab 5   ??? sertraline (ZOLOFT) 50 mg tablet Take 1 Tab by mouth daily. 90 Tab 1   ??? hydrOXYzine HCl (ATARAX) 10 mg tablet Take 1 Tab by mouth three (3) times daily as needed for Itching. 90 Tab 3   ??? EPINEPHrine (EPIPEN) 0.3 mg/0.3 mL injection 0.3 mL by IntraMUSCular route once as needed for up to 1 dose. 1 Syringe 0    ??? montelukast (SINGULAIR) 10 mg tablet TAKE 1 TABLET BY MOUTH DAILY 90 Tab 1   ??? cyclobenzaprine (FLEXERIL) 10 mg tablet Take 1 Tab by mouth three (3) times daily as needed for Muscle Spasm(s). 30 Tab 0   ??? fluticasone (FLONASE) 50 mcg/actuation nasal spray 2 Sprays by Both Nostrils route daily. 1 Bottle 5     Allergies   Allergen Reactions   ??? Shellfish Derived Anaphylaxis     Past Medical History:   Diagnosis Date   ??? Alcohol abuse 10/16/2016   ??? Anxiety    ??? Chronic depression 10/16/2016   ??? Chronic low back pain    ??? Essential hypertension 10/16/2016   ??? Insomnia 09/29/2013     History reviewed. No pertinent surgical history.  Social History     Socioeconomic History   ??? Marital status: MARRIED     Spouse name: Not on file   ??? Number of children: Not on file   ??? Years of education: Not on file   ??? Highest education level: Not on file   Occupational History   ??? Occupation: Sells vascular stents.      Employer: [employer]   Social Needs   ???  Financial resource strain: Not on file   ??? Food insecurity:     Worry: Not on file     Inability: Not on file   ??? Transportation needs:     Medical: Not on file     Non-medical: Not on file   Tobacco Use   ??? Smoking status: Never Smoker   ??? Smokeless tobacco: Never Used   Substance and Sexual Activity   ??? Alcohol use: No     Alcohol/week: 0.0 standard drinks   ??? Drug use: No   ??? Sexual activity: Not on file   Lifestyle   ??? Physical activity:     Days per week: Not on file     Minutes per session: Not on file   ??? Stress: Not on file   Relationships   ??? Social connections:     Talks on phone: Not on file     Gets together: Not on file     Attends religious service: Not on file     Active member of club or organization: Not on file     Attends meetings of clubs or organizations: Not on file     Relationship status: Not on file   ??? Intimate partner violence:     Fear of current or ex partner: Not on file     Emotionally abused: Not on file     Physically abused: Not on file      Forced sexual activity: Not on file   Other Topics Concern   ??? Not on file   Social History Narrative   ??? Not on file     Family History   Problem Relation Age of Onset   ??? Diabetes Brother         Dm1   ??? Cancer Neg Hx    ??? Heart Disease Neg Hx    ??? Heart Attack Neg Hx      Review of Systems   Respiratory: Negative for cough.    Cardiovascular: Negative for leg swelling.   Gastrointestinal: Negative for blood in stool and melena.     Physical Exam   Constitutional: He is oriented to person, place, and time. He appears well-developed and well-nourished. No distress.   HENT:   Head: Normocephalic and atraumatic.   Cardiovascular: Normal rate, regular rhythm, normal heart sounds and intact distal pulses. Exam reveals no gallop and no friction rub.   No murmur heard.  Pulmonary/Chest: Effort normal and breath sounds normal. No respiratory distress. He has no wheezes. He has no rales. He exhibits no tenderness.   Abdominal: Soft. Bowel sounds are normal. He exhibits no distension and no mass. There is no tenderness. There is no rebound and no guarding.   Musculoskeletal: He exhibits no edema.   Neurological: He is alert and oriented to person, place, and time.   Skin: Skin is warm and dry. No rash noted. He is not diaphoretic. No erythema. No pallor.   Psychiatric: He has a normal mood and affect. His behavior is normal. Judgment and thought content normal.   Nursing note and vitals reviewed.  I have reviewed/discussed the above normal BMI with the patient.  I have recommended the following interventions: dietary management education, guidance, and counseling .      ASSESSMENT and PLAN    1. Physical: Mr. Emel is a healthy 57 y.o. male with the long standing medical problems below.   2. HTN: Not well controlled as he's been off his regimen.  Will  restart.  He is asked to monitor his BP at home, call me if it runs above 140/80, and bring in a log next time.    3. HLD (272.4): Well controlled off medications.  Continue TLCs.  Declines Ca Score for CAD screening.   4. Chronic Depression: Well controlled.  Follow up with Psychiatry.   5. HCM:   6. F/u: 6 Months.  Non-fasting labs at that visit.    Diagnoses and all orders for this visit:    1. Routine general medical examination at a health care facility  -     METABOLIC PANEL, BASIC  -     CBC WITH AUTOMATED DIFF  -     LIPID PANEL  -     HEPATIC FUNCTION PANEL  -     TSH 3RD GENERATION  -     URINALYSIS W/O MICRO  -     PSA, DIAGNOSTIC (PROSTATE SPECIFIC AG)    2. Encounter for immunization  -     INFLUENZA VIRUS VAC QUAD,SPLIT,PRESV FREE SYRINGE IM  -     PR IMMUNIZ ADMIN,1 SINGLE/COMB VAC/TOXOID    3. Essential hypertension  -     METABOLIC PANEL, BASIC  -     CBC WITH AUTOMATED DIFF  -     LIPID PANEL  -     HEPATIC FUNCTION PANEL  -     TSH 3RD GENERATION  -     URINALYSIS W/O MICRO  -     losartan-hydroCHLOROthiazide (HYZAAR) 50-12.5 mg per tablet; Take 1 Tab by mouth daily.    4. Mixed hyperlipidemia  -     METABOLIC PANEL, BASIC  -     CBC WITH AUTOMATED DIFF  -     LIPID PANEL  -     HEPATIC FUNCTION PANEL  -     TSH 3RD GENERATION  -     URINALYSIS W/O MICRO    5. Prostate cancer screening  -     PSA, DIAGNOSTIC (PROSTATE SPECIFIC AG)    6. Colon cancer screening  -     REFERRAL TO GASTROENTEROLOGY

## 2018-06-11 NOTE — Patient Instructions (Signed)
Check BP 1-2 times per week.  Call our office if BP runs above 140/80.

## 2018-06-11 NOTE — Addendum Note (Signed)
Addended by: Corene CorneaBROOME, Felton Buczynski A on: 06/11/2018 11:15 AM     Modules accepted: Orders

## 2018-06-11 NOTE — Addendum Note (Signed)
Addendum  Note by Corene Cornea A at 06/11/18 1040                Author: Corene Cornea A  Service: --  Author Type: Technician       Filed: 06/11/18 1115  Encounter Date: 06/11/2018  Status: Signed          Editor: Corene Cornea A (Technician)          Addended by: Corene Cornea A on: 06/11/2018 11:15 AM    Modules accepted: Orders

## 2018-06-11 NOTE — Progress Notes (Signed)
Lona Millard, M.D.  Internal Medicine  [facility]  [address] [place], [place] [address]  Phone: [phone]  Fax: [phone]    HISTORY OF PRESENT ILLNESS  Dustin Noble is a 57 y.o. male.  HPI     Dustin Noble is a Caucasian male who first came to me 07/2012.  He is here for a physical and is feeling/doing well.  He has not been compliant with recommended f/u and has not been here in almost a year and a half.     1. HTN: Taking medications w/o problems.  Current regimen = Losartan-HCTZ 50-12.5 (Non-compliant with this).  Home BPs = Not being checked.   2. HLD (272.4): Controlled with TLCs.   a. FLPs:   i. 07/30/12 Untreated = [193/114/50/145].   ii. 03/22/13 Untreated = [201/136/55/51].   iii. 03/30/14 Untreated = [181/122/49/50].   iv. 04/04/15 Untreated = [179/120/48/56].   v. 04/15/16 Untreated = [177/93/55/143].   vi. 06/11/18 Untreated = [  3. Depression (311): Follows with Psychiatry.  Taking Zoloft without problems and with good control of symptoms.   4. HCM:   ??? Colonoscopy: 09/13/08 in [place] = Tics; No polyps; 7-10 Year Repeat Rec.   ??? Prostate:   o PSA's:  1. 07/30/12 = 0.4.  2. 03/30/14 = 0.3.   3. 04/04/15 = 0.3.   4. 04/15/16 = 1.3.   5. 06/11/18 =   ??? Pneumovax:   ??? Td: Per him elsewhere 05/2016.   ??? Flu: 06/11/18.    Current Outpatient Medications   Medication Sig Dispense Refill   ??? losartan-hydroCHLOROthiazide (HYZAAR) 50-12.5 mg per tablet TAKE 1 TABLET BY MOUTH DAILY 90 Tab 0   ??? traZODone (DESYREL) 50 mg tablet TAKE 1 TABLET BY MOUTH EVERY NIGHT 90 Tab 1   ??? naltrexone (DEPADE) 50 mg tablet Take 1 Tab by mouth daily. 30 Tab 5   ??? sertraline (ZOLOFT) 50 mg tablet Take 1 Tab by mouth daily. 90 Tab 1   ??? hydrOXYzine HCl (ATARAX) 10 mg tablet Take 1 Tab by mouth three (3) times daily as needed for Itching. 90 Tab 3   ??? EPINEPHrine (EPIPEN) 0.3 mg/0.3 mL injection 0.3 mL by IntraMUSCular route once as needed for up to 1 dose. 1 Syringe 0   ??? montelukast (SINGULAIR) 10 mg tablet TAKE 1 TABLET BY  MOUTH DAILY 90 Tab 1   ??? cyclobenzaprine (FLEXERIL) 10 mg tablet Take 1 Tab by mouth three (3) times daily as needed for Muscle Spasm(s). 30 Tab 0   ??? fluticasone (FLONASE) 50 mcg/actuation nasal spray 2 Sprays by Both Nostrils route daily. 1 Bottle 5     Allergies   Allergen Reactions   ??? Shellfish Derived Anaphylaxis     Past Medical History:   Diagnosis Date   ??? Alcohol abuse 10/16/2016   ??? Anxiety    ??? Chronic depression 10/16/2016   ??? Chronic low back pain    ??? Essential hypertension 10/16/2016   ??? Insomnia 09/29/2013     History reviewed. No pertinent surgical history.  Social History     Socioeconomic History   ??? Marital status: MARRIED     Spouse name: Not on file   ??? Number of children: Not on file   ??? Years of education: Not on file   ??? Highest education level: Not on file   Occupational History   ??? Occupation: Sells vascular stents.      Employer: [employer]   Social Needs   ???  Financial resource strain: Not on file   ??? Food insecurity:     Worry: Not on file     Inability: Not on file   ??? Transportation needs:     Medical: Not on file     Non-medical: Not on file   Tobacco Use   ??? Smoking status: Never Smoker   ??? Smokeless tobacco: Never Used   Substance and Sexual Activity   ??? Alcohol use: No     Alcohol/week: 0.0 standard drinks   ??? Drug use: No   ??? Sexual activity: Not on file   Lifestyle   ??? Physical activity:     Days per week: Not on file     Minutes per session: Not on file   ??? Stress: Not on file   Relationships   ??? Social connections:     Talks on phone: Not on file     Gets together: Not on file     Attends religious service: Not on file     Active member of club or organization: Not on file     Attends meetings of clubs or organizations: Not on file     Relationship status: Not on file   ??? Intimate partner violence:     Fear of current or ex partner: Not on file     Emotionally abused: Not on file     Physically abused: Not on file     Forced sexual activity: Not on file   Other Topics  Concern   ??? Not on file   Social History Narrative   ??? Not on file     Family History   Problem Relation Age of Onset   ??? Diabetes Brother         Dm1   ??? Cancer Neg Hx    ??? Heart Disease Neg Hx    ??? Heart Attack Neg Hx      Review of Systems   Respiratory: Negative for cough.    Cardiovascular: Negative for leg swelling.   Gastrointestinal: Negative for blood in stool and melena.     Physical Exam   Constitutional: He is oriented to person, place, and time. He appears well-developed and well-nourished. No distress.   HENT:   Head: Normocephalic and atraumatic.   Cardiovascular: Normal rate, regular rhythm, normal heart sounds and intact distal pulses. Exam reveals no gallop and no friction rub.   No murmur heard.  Pulmonary/Chest: Effort normal and breath sounds normal. No respiratory distress. He has no wheezes. He has no rales. He exhibits no tenderness.   Abdominal: Soft. Bowel sounds are normal. He exhibits no distension and no mass. There is no tenderness. There is no rebound and no guarding.   Musculoskeletal: He exhibits no edema.   Neurological: He is alert and oriented to person, place, and time.   Skin: Skin is warm and dry. No rash noted. He is not diaphoretic. No erythema. No pallor.   Psychiatric: He has a normal mood and affect. His behavior is normal. Judgment and thought content normal.   Nursing note and vitals reviewed.  I have reviewed/discussed the above normal BMI with the patient.  I have recommended the following interventions: dietary management education, guidance, and counseling .      ASSESSMENT and PLAN    1. Physical: Dustin Noble is a healthy 57 y.o. male with the long standing medical problems below.   2. HTN: Not well controlled as he's been off his regimen.  Will  restart.  He is asked to monitor his BP at home, call me if it runs above 140/80, and bring in a log next time.   3. HLD (272.4): Well controlled off medications.  Continue TLCs.  Declines Ca Score for CAD screening.    4. Chronic Depression: Well controlled.  Follow up with Psychiatry.   5. HCM:   6. F/u: 6 Months.  Non-fasting labs at that visit.    Diagnoses and all orders for this visit:    1. Routine general medical examination at a health care facility  -     METABOLIC PANEL, BASIC  -     CBC WITH AUTOMATED DIFF  -     LIPID PANEL  -     HEPATIC FUNCTION PANEL  -     TSH 3RD GENERATION  -     URINALYSIS W/O MICRO  -     PSA, DIAGNOSTIC (PROSTATE SPECIFIC AG)    2. Encounter for immunization  -     INFLUENZA VIRUS VAC QUAD,SPLIT,PRESV FREE SYRINGE IM  -     PR IMMUNIZ ADMIN,1 SINGLE/COMB VAC/TOXOID    3. Essential hypertension  -     METABOLIC PANEL, BASIC  -     CBC WITH AUTOMATED DIFF  -     LIPID PANEL  -     HEPATIC FUNCTION PANEL  -     TSH 3RD GENERATION  -     URINALYSIS W/O MICRO  -     losartan-hydroCHLOROthiazide (HYZAAR) 50-12.5 mg per tablet; Take 1 Tab by mouth daily.    4. Mixed hyperlipidemia  -     METABOLIC PANEL, BASIC  -     CBC WITH AUTOMATED DIFF  -     LIPID PANEL  -     HEPATIC FUNCTION PANEL  -     TSH 3RD GENERATION  -     URINALYSIS W/O MICRO    5. Prostate cancer screening  -     PSA, DIAGNOSTIC (PROSTATE SPECIFIC AG)    6. Colon cancer screening  -     REFERRAL TO GASTROENTEROLOGY

## 2018-06-12 LAB — CBC WITH AUTO DIFFERENTIAL
Basophils %: 1 %
Basophils Absolute: 0.1 10*3/uL (ref 0.0–0.2)
Eosinophils %: 3 %
Eosinophils Absolute: 0.2 10*3/uL (ref 0.0–0.4)
Granulocyte Absolute Count: 0.1 10*3/uL (ref 0.0–0.1)
Hematocrit: 46.4 % (ref 37.5–51.0)
Hemoglobin: 15.4 g/dL (ref 13.0–17.7)
Immature Granulocytes: 1 %
Lymphocytes %: 29 %
Lymphocytes Absolute: 2.1 10*3/uL (ref 0.7–3.1)
MCH: 31.7 pg (ref 26.6–33.0)
MCHC: 33.2 g/dL (ref 31.5–35.7)
MCV: 96 fL (ref 79–97)
Monocytes %: 8 %
Monocytes Absolute: 0.6 10*3/uL (ref 0.1–0.9)
Neutrophils %: 58 %
Neutrophils Absolute: 4.3 10*3/uL (ref 1.4–7.0)
Platelets: 257 10*3/uL (ref 150–450)
RBC: 4.86 x10E6/uL (ref 4.14–5.80)
RDW: 12.4 % (ref 12.3–15.4)
WBC: 7.3 10*3/uL (ref 3.4–10.8)

## 2018-06-12 LAB — LIPID PANEL
Cholesterol, Total: 201 mg/dL — ABNORMAL HIGH (ref 100–199)
Cholesterol, total: 201 mg/dL — ABNORMAL HIGH (ref 100–199)
HDL Cholesterol: 43 mg/dL (ref >39)
HDL: 43 mg/dL (ref >39)
Triglyceride: 502 mg/dL — ABNORMAL HIGH (ref 0–149)
Triglycerides: 502 mg/dL — ABNORMAL HIGH (ref 0–149)

## 2018-06-12 LAB — HEPATIC FUNCTION PANEL
ALT (SGPT): 38 IU/L (ref 0–44)
ALT: 38 IU/L (ref 0–44)
AST (SGOT): 39 IU/L (ref 0–40)
AST: 39 IU/L (ref 0–40)
Albumin: 4.2 g/dL (ref 3.5–5.5)
Albumin: 4.2 g/dL (ref 3.5–5.5)
Alk. phosphatase: 75 IU/L (ref 39–117)
Alkaline Phosphatase: 75 IU/L (ref 39–117)
Bilirubin, Direct: 0.19 mg/dL (ref 0.00–0.40)
Bilirubin, direct: 0.19 mg/dL (ref 0.00–0.40)
Bilirubin, total: 0.5 mg/dL (ref 0.0–1.2)
Protein, total: 6.7 g/dL (ref 6.0–8.5)
Total Bilirubin: 0.5 mg/dL (ref 0.0–1.2)
Total Protein: 6.7 g/dL (ref 6.0–8.5)

## 2018-06-12 LAB — BASIC METABOLIC PANEL
BUN: 17 mg/dL (ref 6–24)
Bun/Cre Ratio: 16 NA (ref 9–20)
CO2: 22 mmol/L (ref 20–29)
Calcium: 9.5 mg/dL (ref 8.7–10.2)
Chloride: 101 mmol/L (ref 96–106)
Creatinine: 1.05 mg/dL (ref 0.76–1.27)
EGFR IF NonAfrican American: 78 mL/min/{1.73_m2} (ref >59)
GFR African American: 91 mL/min/{1.73_m2} (ref >59)
Glucose: 89 mg/dL (ref 65–99)
Potassium: 4.3 mmol/L (ref 3.5–5.2)
Sodium: 139 mmol/L (ref 134–144)

## 2018-06-12 LAB — PSA PROSTATIC SPECIFIC ANTIGEN: PSA: 1 ng/mL (ref 0.0–4.0)

## 2018-06-12 LAB — TSH 3RD GENERATION
TSH: 3.1 u[IU]/mL (ref 0.450–4.500)
TSH: 3.1 u[IU]/mL (ref 0.450–4.500)

## 2018-06-12 LAB — METABOLIC PANEL, BASIC
BUN/Creatinine ratio: 16 (ref 9–20)
BUN: 17 mg/dL (ref 6–24)
CO2: 22 mmol/L (ref 20–29)
Calcium: 9.5 mg/dL (ref 8.7–10.2)
Chloride: 101 mmol/L (ref 96–106)
Creatinine: 1.05 mg/dL (ref 0.76–1.27)
GFR est AA: 91 mL/min/{1.73_m2} (ref >59)
GFR est non-AA: 78 mL/min/{1.73_m2} (ref >59)
Glucose: 89 mg/dL (ref 65–99)
Potassium: 4.3 mmol/L (ref 3.5–5.2)
Sodium: 139 mmol/L (ref 134–144)

## 2018-06-12 LAB — CBC WITH AUTOMATED DIFF
ABS. BASOPHILS: 0.1 10*3/uL (ref 0.0–0.2)
ABS. EOSINOPHILS: 0.2 10*3/uL (ref 0.0–0.4)
ABS. IMM. GRANS.: 0.1 10*3/uL (ref 0.0–0.1)
ABS. MONOCYTES: 0.6 10*3/uL (ref 0.1–0.9)
ABS. NEUTROPHILS: 4.3 10*3/uL (ref 1.4–7.0)
Abs Lymphocytes: 2.1 10*3/uL (ref 0.7–3.1)
BASOPHILS: 1 %
EOSINOPHILS: 3 %
HCT: 46.4 % (ref 37.5–51.0)
HGB: 15.4 g/dL (ref 13.0–17.7)
IMMATURE GRANULOCYTES: 1 %
Lymphocytes: 29 %
MCH: 31.7 pg (ref 26.6–33.0)
MCHC: 33.2 g/dL (ref 31.5–35.7)
MCV: 96 fL (ref 79–97)
MONOCYTES: 8 %
NEUTROPHILS: 58 %
PLATELET: 257 10*3/uL (ref 150–450)
RBC: 4.86 x10E6/uL (ref 4.14–5.80)
RDW: 12.4 % (ref 12.3–15.4)
WBC: 7.3 10*3/uL (ref 3.4–10.8)

## 2018-06-12 LAB — PSA, DIAGNOSTIC (PROSTATE SPECIFIC AG): Prostate Specific Ag: 1 ng/mL (ref 0.0–4.0)

## 2018-12-10 ENCOUNTER — Encounter: Payer: BLUE CROSS/BLUE SHIELD | Attending: Internal Medicine | Primary: Internal Medicine

## 2018-12-23 ENCOUNTER — Telehealth
Admit: 2018-12-23 | Discharge: 2018-12-23 | Payer: BLUE CROSS/BLUE SHIELD | Attending: Internal Medicine | Primary: Internal Medicine

## 2018-12-23 ENCOUNTER — Telehealth: Attending: Internal Medicine | Primary: Internal Medicine

## 2018-12-23 DIAGNOSIS — I10 Essential (primary) hypertension: Secondary | ICD-10-CM

## 2018-12-23 MED ORDER — SERTRALINE 50 MG TAB
50 mg | ORAL_TABLET | Freq: Every day | ORAL | 1 refills | Status: AC
Start: 2018-12-23 — End: ?

## 2018-12-23 MED ORDER — TRAZODONE 50 MG TAB
50 mg | ORAL_TABLET | ORAL | 1 refills | Status: AC
Start: 2018-12-23 — End: ?

## 2018-12-23 MED ORDER — LOSARTAN-HYDROCHLOROTHIAZIDE 50 MG-12.5 MG TAB
ORAL_TABLET | Freq: Every day | ORAL | 1 refills | Status: AC
Start: 2018-12-23 — End: ?

## 2018-12-23 NOTE — Patient Instructions (Signed)
Check BP 1-2 times per week.  Call our office if BP runs above 140/80.

## 2018-12-23 NOTE — Progress Notes (Signed)
Lona Millard, M.D.  Internal Medicine  [facility]  [address] [place], [place] [address]  Phone: [phone]  Fax: [phone]    HISTORY OF PRESENT ILLNESS  Dustin Noble is a 58 y.o. male.  Hypertension    The history is provided by the patient and medical records. This is a chronic problem. The current episode started more than 1 week ago. The problem has not changed since onset.Pertinent negatives include no chest pain and no shortness of breath. There are no associated agents to hypertension. Risk factors include male gender, hypertension, dyslipidemia and obesity.        Dustin Noble is a Caucasian male who first came to me 07/2012.  This is a telehealth virtual visit using video/audio through [email or web address] with which the patient consented.      1. HTN: Taking medications w/o problems.  Current regimen = Losartan-HCTZ 50-12.5.  130s/70s.   2. HLD (272.4): Controlled with TLCs.   a. FLPs:   i. 07/30/12 Untreated = [193/114/50/145].   ii. 03/22/13 Untreated = [201/136/55/51].   iii. 03/30/14 Untreated = [181/122/49/50].   iv. 04/04/15 Untreated = [179/120/48/56].   v. 04/15/16 Untreated = [177/93/55/143].   vi. 06/11/18 Untreated = [201/Unc/43/502].   3. Depression (311): Non-compliant with Psychiatry f/u.  Taking Zoloft and Trazodone (For sleep) without problems and with good control of symptoms.   4. HCM:   ? Colonoscopy: 09/13/08 in [place] = Tics; No polyps; 7-10 Year Repeat Rec.  06/11/18 referred and has been non-compliant.   ? Prostate:   o PSA's:  1. 07/30/12 = 0.4.  2. 03/30/14 = 0.3.   3. 04/04/15 = 0.3.   4. 04/15/16 = 1.3.   5. 06/11/18 = 1.0.   ? Pneumovax:   ? Td: Per him elsewhere 05/2016.   ? Flu: 06/11/18.    Current Outpatient Medications   Medication Sig Dispense Refill   ??? losartan-hydroCHLOROthiazide (HYZAAR) 50-12.5 mg per tablet Take 1 Tab by mouth daily. 90 Tab 0   ??? traZODone (DESYREL) 50 mg tablet TAKE 1 TABLET BY MOUTH EVERY NIGHT 90 Tab 1     Allergies   Allergen Reactions    ??? Shellfish Derived Anaphylaxis     Past Medical History:   Diagnosis Date   ??? Alcohol abuse 10/16/2016   ??? Anxiety    ??? Chronic depression 10/16/2016   ??? Chronic low back pain    ??? Essential hypertension 10/16/2016   ??? Insomnia 09/29/2013     No past surgical history on file.  Social History     Socioeconomic History   ??? Marital status: MARRIED     Spouse name: Not on file   ??? Number of children: Not on file   ??? Years of education: Not on file   ??? Highest education level: Not on file   Occupational History   ??? Occupation: Sells vascular stents.      Employer: [employer]   Social Needs   ??? Financial resource strain: Not on file   ??? Food insecurity     Worry: Not on file     Inability: Not on file   ??? Transportation needs     Medical: Not on file     Non-medical: Not on file   Tobacco Use   ??? Smoking status: Never Smoker   ??? Smokeless tobacco: Never Used   Substance and Sexual Activity   ??? Alcohol use: No     Alcohol/week: 0.0 standard drinks   ???  Drug use: No   ??? Sexual activity: Not on file   Lifestyle   ??? Physical activity     Days per week: Not on file     Minutes per session: Not on file   ??? Stress: Not on file   Relationships   ??? Social Wellsite geologistconnections     Talks on phone: Not on file     Gets together: Not on file     Attends religious service: Not on file     Active member of club or organization: Not on file     Attends meetings of clubs or organizations: Not on file     Relationship status: Not on file   ??? Intimate partner violence     Fear of current or ex partner: Not on file     Emotionally abused: Not on file     Physically abused: Not on file     Forced sexual activity: Not on file   Other Topics Concern   ??? Not on file   Social History Narrative   ??? Not on file     Family History   Problem Relation Age of Onset   ??? Diabetes Brother         Dm1   ??? Cancer Neg Hx    ??? Heart Disease Neg Hx    ??? Heart Attack Neg Hx      Review of Systems   Respiratory: Negative for cough and shortness of breath.     Cardiovascular: Negative for chest pain and leg swelling.   Gastrointestinal: Negative for abdominal pain, blood in stool and melena.     Physical Exam   Constitutional: He appears well-developed and well-nourished. No distress.   Pulmonary/Chest: Effort normal. No respiratory distress.   Neurological: He is alert.   Skin: He is not diaphoretic.   Psychiatric: He has a normal mood and affect. His behavior is normal. Judgment and thought content normal.   I have reviewed/discussed the above normal BMI with the patient.  I have recommended the following interventions: dietary management education, guidance, and counseling .      ASSESSMENT and PLAN    1. HTN: Well controlled.  Continue current regimen.  He is asked to continue to monitor his BP at home, call me if it runs above 140/80, and bring in a log next time.   2. HLD (272.4): Well controlled off medications.  Continue TLCs.  Declines Ca Score for CAD screening.   3. Depression: Well controlled.  Continue current regimen.  Recommend follow up with Psychiatry.   4. HCM:   5. F/u: 6 Months.  Fasting labs at that visit.    Diagnoses and all orders for this visit:    1. Essential hypertension  -     losartan-hydroCHLOROthiazide (HYZAAR) 50-12.5 mg per tablet; Take 1 Tab by mouth daily.    2. Mixed hyperlipidemia    3. Mild depression (HCC)  -     traZODone (DESYREL) 50 mg tablet; TAKE 1 TABLET BY MOUTH EVERY NIGHT  -     sertraline (Zoloft) 50 mg tablet; Take 1 Tab by mouth daily.

## 2018-12-23 NOTE — Progress Notes (Signed)
Dustin Noble E. Dustin Noble, M.D.  Internal Medicine  [facility]  [address] [place], [place] [id number]  Phone: [phone]  Fax: [phone]    HISTORY OF PRESENT ILLNESS  Dustin BunchChristopher A Noble is a 58 y.o. male.  Hypertension    The history is provided by the patient and medical records. This is a chronic problem. The current episode started more than 1 week ago. The problem has not changed since onset.Pertinent negatives include no chest pain and no shortness of breath. There are no associated agents to hypertension. Risk factors include male gender, hypertension, dyslipidemia and obesity.        Dustin Noble is a Caucasian male who first came to me 07/2012.  This is a telehealth virtual visit using video/audio through [email or web address] with which the patient consented.      1. HTN: Taking medications w/o problems.  Current regimen = Losartan-HCTZ 50-12.5.  130s/70s.   2. HLD (272.4): Controlled with TLCs.   a. FLPs:   i. 07/30/12 Untreated = [193/114/50/145].   ii. 03/22/13 Untreated = [201/136/55/51].   iii. 03/30/14 Untreated = [181/122/49/50].   iv. 04/04/15 Untreated = [179/120/48/56].   v. 04/15/16 Untreated = [177/93/55/143].   vi. 06/11/18 Untreated = [201/Unc/43/502].   3. Depression (311): Non-compliant with Psychiatry f/u.  Taking Zoloft and Trazodone (For sleep) without problems and with good control of symptoms.   4. HCM:   ??? Colonoscopy: 09/13/08 in [place] = Tics; No polyps; 7-10 Year Repeat Rec.  06/11/18 referred and has been non-compliant.   ??? Prostate:   o PSA's:  1. 07/30/12 = 0.4.  2. 03/30/14 = 0.3.   3. 04/04/15 = 0.3.   4. 04/15/16 = 1.3.   5. 06/11/18 = 1.0.   ??? Pneumovax:   ??? Td: Per him elsewhere 05/2016.   ??? Flu: 06/11/18.    Current Outpatient Medications   Medication Sig Dispense Refill   ??? losartan-hydroCHLOROthiazide (HYZAAR) 50-12.5 mg per tablet Take 1 Tab by mouth daily. 90 Tab 0   ??? traZODone (DESYREL) 50 mg tablet TAKE 1 TABLET BY MOUTH EVERY NIGHT 90 Tab 1     Allergies   Allergen Reactions   ??? Shellfish  Derived Anaphylaxis     Past Medical History:   Diagnosis Date   ??? Alcohol abuse 10/16/2016   ??? Anxiety    ??? Chronic depression 10/16/2016   ??? Chronic low back pain    ??? Essential hypertension 10/16/2016   ??? Insomnia 09/29/2013     No past surgical history on file.  Social History     Socioeconomic History   ??? Marital status: MARRIED     Spouse name: Not on file   ??? Number of children: Not on file   ??? Years of education: Not on file   ??? Highest education level: Not on file   Occupational History   ??? Occupation: Sells vascular stents.      Employer: [employer]   Social Needs   ??? Financial resource strain: Not on file   ??? Food insecurity     Worry: Not on file     Inability: Not on file   ??? Transportation needs     Medical: Not on file     Non-medical: Not on file   Tobacco Use   ??? Smoking status: Never Smoker   ??? Smokeless tobacco: Never Used   Substance and Sexual Activity   ??? Alcohol use: No     Alcohol/week: 0.0 standard drinks   ???  Drug use: No   ??? Sexual activity: Not on file   Lifestyle   ??? Physical activity     Days per week: Not on file     Minutes per session: Not on file   ??? Stress: Not on file   Relationships   ??? Social Wellsite geologist on phone: Not on file     Gets together: Not on file     Attends religious service: Not on file     Active member of club or organization: Not on file     Attends meetings of clubs or organizations: Not on file     Relationship status: Not on file   ??? Intimate partner violence     Fear of current or ex partner: Not on file     Emotionally abused: Not on file     Physically abused: Not on file     Forced sexual activity: Not on file   Other Topics Concern   ??? Not on file   Social History Narrative   ??? Not on file     Family History   Problem Relation Age of Onset   ??? Diabetes Brother         Dm1   ??? Cancer Neg Hx    ??? Heart Disease Neg Hx    ??? Heart Attack Neg Hx      Review of Systems   Respiratory: Negative for cough and shortness of breath.    Cardiovascular:  Negative for chest pain and leg swelling.   Gastrointestinal: Negative for abdominal pain, blood in stool and melena.     Physical Exam   Constitutional: He appears well-developed and well-nourished. No distress.   Pulmonary/Chest: Effort normal. No respiratory distress.   Neurological: He is alert.   Skin: He is not diaphoretic.   Psychiatric: He has a normal mood and affect. His behavior is normal. Judgment and thought content normal.   I have reviewed/discussed the above normal BMI with the patient.  I have recommended the following interventions: dietary management education, guidance, and counseling .      ASSESSMENT and PLAN    1. HTN: Well controlled.  Continue current regimen.  He is asked to continue to monitor his BP at home, call me if it runs above 140/80, and bring in a log next time.   2. HLD (272.4): Well controlled off medications.  Continue TLCs.  Declines Ca Score for CAD screening.   3. Depression: Well controlled.  Continue current regimen.  Recommend follow up with Psychiatry.   4. HCM:   5. F/u: 6 Months.  Fasting labs at that visit.    Diagnoses and all orders for this visit:    1. Essential hypertension  -     losartan-hydroCHLOROthiazide (HYZAAR) 50-12.5 mg per tablet; Take 1 Tab by mouth daily.    2. Mixed hyperlipidemia    3. Mild depression (HCC)  -     traZODone (DESYREL) 50 mg tablet; TAKE 1 TABLET BY MOUTH EVERY NIGHT  -     sertraline (Zoloft) 50 mg tablet; Take 1 Tab by mouth daily.

## 2019-06-21 ENCOUNTER — Encounter: Payer: BLUE CROSS/BLUE SHIELD | Attending: Internal Medicine | Primary: Internal Medicine

## 2019-07-22 ENCOUNTER — Encounter

## 2021-05-01 ENCOUNTER — Other Ambulatory Visit: Payer: Self-pay | Admitting: Family Medicine

## 2021-05-01 DIAGNOSIS — R1013 Epigastric pain: Secondary | ICD-10-CM

## 2021-05-01 DIAGNOSIS — I10 Essential (primary) hypertension: Secondary | ICD-10-CM

## 2021-05-17 ENCOUNTER — Other Ambulatory Visit: Payer: Self-pay

## 2021-05-17 ENCOUNTER — Ambulatory Visit
Admission: RE | Admit: 2021-05-17 | Discharge: 2021-05-17 | Disposition: A | Discharge status: Home / Self Care | Payer: Self-pay | Source: Ambulatory Visit | Attending: Family Medicine | Admitting: Family Medicine

## 2021-05-17 DIAGNOSIS — R1013 Epigastric pain: Secondary | ICD-10-CM

## 2021-05-17 DIAGNOSIS — I10 Essential (primary) hypertension: Secondary | ICD-10-CM

## 2021-05-22 ENCOUNTER — Ambulatory Visit
Admission: RE | Admit: 2021-05-22 | Discharge: 2021-05-22 | Disposition: A | Discharge status: Home / Self Care | Payer: BC Managed Care – PPO | Source: Ambulatory Visit | Attending: Family Medicine | Admitting: Family Medicine

## 2021-05-22 ENCOUNTER — Other Ambulatory Visit: Payer: Self-pay

## 2021-05-22 DIAGNOSIS — I10 Essential (primary) hypertension: Secondary | ICD-10-CM | POA: Insufficient documentation

## 2021-05-22 DIAGNOSIS — R1013 Epigastric pain: Secondary | ICD-10-CM | POA: Insufficient documentation

## 2021-06-11 ENCOUNTER — Other Ambulatory Visit: Payer: Self-pay | Admitting: Family Medicine

## 2021-06-11 DIAGNOSIS — K8050 Calculus of bile duct without cholangitis or cholecystitis without obstruction: Secondary | ICD-10-CM

## 2021-06-26 ENCOUNTER — Ambulatory Visit
Admission: RE | Admit: 2021-06-26 | Discharge: 2021-06-26 | Disposition: A | Discharge status: Home / Self Care | Payer: BC Managed Care – PPO | Source: Ambulatory Visit | Attending: Family Medicine | Admitting: Family Medicine

## 2021-06-26 DIAGNOSIS — K8050 Calculus of bile duct without cholangitis or cholecystitis without obstruction: Secondary | ICD-10-CM | POA: Insufficient documentation

## 2021-06-26 MED ORDER — TECHNETIUM TC 99M MEBROFENIN IV KIT
5.0000 | PACK | Freq: Once | INTRAVENOUS | Status: AC | PRN
  Administered 2021-06-26: 5.2 via INTRAVENOUS

## 2021-07-25 ENCOUNTER — Emergency Department: Payer: BC Managed Care – PPO

## 2021-07-25 ENCOUNTER — Emergency Department
Admission: EM | Admit: 2021-07-25 | Discharge: 2021-07-25 | Disposition: A | Discharge status: Home / Self Care | Payer: BC Managed Care – PPO | Attending: Emergency Medicine | Admitting: Emergency Medicine

## 2021-07-25 ENCOUNTER — Encounter: Payer: Self-pay | Admitting: Emergency Medicine

## 2021-07-25 ENCOUNTER — Other Ambulatory Visit: Payer: Self-pay

## 2021-07-25 DIAGNOSIS — K859 Acute pancreatitis without necrosis or infection, unspecified: Secondary | ICD-10-CM | POA: Insufficient documentation

## 2021-07-25 DIAGNOSIS — R079 Chest pain, unspecified: Secondary | ICD-10-CM | POA: Insufficient documentation

## 2021-07-25 DIAGNOSIS — D72829 Elevated white blood cell count, unspecified: Secondary | ICD-10-CM | POA: Diagnosis not present

## 2021-07-25 DIAGNOSIS — R109 Unspecified abdominal pain: Secondary | ICD-10-CM | POA: Diagnosis present

## 2021-07-25 LAB — URINALYSIS, COMPLETE (UACMP) WITH MICROSCOPIC
Bacteria, UA: NONE SEEN
Bilirubin Urine: NEGATIVE
Glucose, UA: NEGATIVE mg/dL
Hgb urine dipstick: NEGATIVE
Ketones, ur: NEGATIVE mg/dL
Leukocytes,Ua: NEGATIVE
Nitrite: NEGATIVE
Protein, ur: NEGATIVE mg/dL
Specific Gravity, Urine: 1.028 (ref 1.005–1.030)
Squamous Epithelial / HPF: NONE SEEN (ref 0–5)
pH: 5 (ref 5.0–8.0)

## 2021-07-25 LAB — BASIC METABOLIC PANEL
Anion gap: 12 (ref 5–15)
BUN: 18 mg/dL (ref 6–20)
CO2: 18 mmol/L — ABNORMAL LOW (ref 22–32)
Calcium: 8.8 mg/dL — ABNORMAL LOW (ref 8.9–10.3)
Chloride: 102 mmol/L (ref 98–111)
Creatinine, Ser: 1 mg/dL (ref 0.61–1.24)
GFR, Estimated: >60 mL/min (ref >60)
Glucose, Bld: 136 mg/dL — ABNORMAL HIGH (ref 70–99)
Potassium: 3.6 mmol/L (ref 3.5–5.1)
Sodium: 132 mmol/L — ABNORMAL LOW (ref 135–145)

## 2021-07-25 LAB — CBC
HCT: 45.2 % (ref 39.0–52.0)
Hemoglobin: 16.2 g/dL (ref 13.0–17.0)
MCH: 33.6 pg (ref 26.0–34.0)
MCHC: 35.8 g/dL (ref 30.0–36.0)
MCV: 93.8 fL (ref 80.0–100.0)
Platelets: 220 10*3/uL (ref 150–400)
RBC: 4.82 MIL/uL (ref 4.22–5.81)
RDW: 11.9 % (ref 11.5–15.5)
WBC: 10.8 10*3/uL — ABNORMAL HIGH (ref 4.0–10.5)
nRBC: 0 % (ref 0.0–0.2)

## 2021-07-25 LAB — HEPATIC FUNCTION PANEL
ALT: 31 U/L (ref 0–44)
AST: 47 U/L — ABNORMAL HIGH (ref 15–41)
Albumin: 3.9 g/dL (ref 3.5–5.0)
Alkaline Phosphatase: 101 U/L (ref 38–126)
Bilirubin, Direct: 0.2 mg/dL (ref 0.0–0.2)
Indirect Bilirubin: 1.5 mg/dL — ABNORMAL HIGH (ref 0.3–0.9)
Total Bilirubin: 1.7 mg/dL — ABNORMAL HIGH (ref 0.3–1.2)
Total Protein: 7.3 g/dL (ref 6.5–8.1)

## 2021-07-25 LAB — TROPONIN I (HIGH SENSITIVITY)
Troponin I (High Sensitivity): 7 ng/L (ref <18)
Troponin I (High Sensitivity): 6 ng/L (ref <18)

## 2021-07-25 LAB — LIPASE, BLOOD: Lipase: 71 U/L — ABNORMAL HIGH (ref 11–51)

## 2021-07-25 MED ORDER — OXYCODONE-ACETAMINOPHEN 5-325 MG PO TABS: 1.0000 | ORAL_TABLET | ORAL | 0 refills | Status: AC | PRN

## 2021-07-25 MED ORDER — MORPHINE SULFATE (PF) 4 MG/ML IV SOLN
4.0000 mg | Freq: Once | INTRAVENOUS | Status: AC
  Administered 2021-07-25: 4 mg via INTRAVENOUS
  Filled 2021-07-25: qty 1

## 2021-07-25 MED ORDER — ONDANSETRON HCL 4 MG/2ML IJ SOLN
4.0000 mg | Freq: Once | INTRAMUSCULAR | Status: AC
  Administered 2021-07-25: 4 mg via INTRAVENOUS
  Filled 2021-07-25: qty 2

## 2021-07-25 MED ORDER — IOHEXOL 300 MG/ML  SOLN
100.0000 mL | Freq: Once | INTRAMUSCULAR | Status: AC | PRN
  Administered 2021-07-25: 100 mL via INTRAVENOUS

## 2021-07-25 NOTE — Discharge Instructions (Addendum)
As we discussed please discontinue use of alcohol.  Please adhere to a clear liquid diet for the next 4 days.  After which you may slowly increase solid foods but avoid any fatty oily or greasy foods for the next 2 weeks.  Please call the number provided for GI medicine to arrange a follow-up appointment as soon as possible.  Return to the emergency department for any worsening pain, or any other symptom personally concerning to yourself.

## 2021-07-25 NOTE — ED Notes (Signed)
Sent rainbow to lab. 

## 2021-07-25 NOTE — Progress Notes (Signed)
Called to admit patient for acute on chronic pancreatitis most likely related to alcohol use.  Patient declines admission to the hospital at this time. ER physician aware.

## 2021-07-25 NOTE — ED Notes (Signed)
Pt to ED c/o mid CP and upper abdominal pain that has gotten worse over the last week. Pt states he has had cp and abd pain for a couple months, but it has progressively gotten worse. Pt also has Nausea and diarrhea.  Pt has hx of HTN but Denies other Cardiac, and GI hx.    Denies SOB Pt is A&Ox4, NAD

## 2021-07-25 NOTE — ED Provider Notes (Signed)
[facility] Emergency Department Provider Note  Time seen: 11:58 AM  I have reviewed the triage vital signs and the nursing notes.   HISTORY  Chief Complaint Abdominal Pain and Chest Pain   HPI Ricky Herman is a 60 y.o. male presents to the emergency department for abdominal pain.  According to the patient for the past several months he has been experiencing abdominal pain at times radiating into the chest.  States he has been seen by his doctor and has been worked up including a gallbladder work-up/HIDA scan, with no findings.  Patient states the neck step in the work-up is referral to GI for an endoscopy which has not yet occurred.  Patient states over the past 6 days however the abdominal pain has worsened.  Feeling nauseated at times, normal bowel movements including this morning.  No dysuria or hematuria.  No association with food although patient states he is not been eating much due to nausea.  Describes the pain as an 8/10 bandlike pain across the abdomen.  Denies any chest pain currently.   History reviewed. No pertinent past medical history.  There are no problems to display for this patient.   Prior to Admission medications   Not on File    No Known Allergies  History reviewed. No pertinent family history.  Social History    Review of Systems Constitutional: Negative for fever. Cardiovascular: Negative for chest pain. Respiratory: Negative for shortness of breath. Gastrointestinal: Moderate abdominal pain across the mid abdomen.  Positive for nausea.  Negative for vomiting or diarrhea.  Normal bowel movement this morning. Genitourinary: Negative for urinary compaints Musculoskeletal: Negative for musculoskeletal complaints Neurological: Negative for headache All other ROS negative  ____________________________________________   PHYSICAL EXAM:  VITAL SIGNS: ED Triage Vitals  Enc Vitals Group     BP 07/25/21 1103 (!) 137/91      Pulse Rate 07/25/21 1103 78     Resp 07/25/21 1103 17     Temp 07/25/21 1103 99.1 F (37.3 C)     Temp Source 07/25/21 1103 Oral     SpO2 07/25/21 1103 96 %     Weight 07/25/21 1041 229 lb 15 oz (104.3 kg)     Height 07/25/21 1041 6\' 2"  (1.88 m)     Head Circumference --      [place] Flow --      Pain Score 07/25/21 1040 9     Pain Loc --      Pain Edu? --      Excl. in GC? --    Constitutional: Alert and oriented. Well appearing and in no distress. Eyes: Normal exam ENT      Head: Normocephalic and atraumatic.      Mouth/Throat: Mucous membranes are moist. Cardiovascular: Normal rate, regular rhythm.  Respiratory: Normal respiratory effort without tachypnea nor retractions. Breath sounds are clear  Gastrointestinal: Soft, mild diffuse tenderness without focal tenderness identified.  No significant tenderness identified.  No rebound guarding or distention. Musculoskeletal: Nontender with normal range of motion in all extremities. Neurologic:  Normal speech and language. No gross focal neurologic deficits Skin:  Skin is warm, dry and intact.  Psychiatric: Mood and affect are normal.  ____________________________________________    EKG  EKG viewed and interpreted by myself shows a normal sinus rhythm at 74 bpm with a narrow QRS, normal axis, normal intervals, no concerning ST changes.  ____________________________________________    RADIOLOGY  Chest x-ray is negative  ____________________________________________   INITIAL IMPRESSION /  ASSESSMENT AND PLAN / ED COURSE  Pertinent labs & imaging results that were available during my care of the patient were reviewed by me and considered in my medical decision making (see chart for details).   Patient presents to the emergency department for several months of intermittent abdominal pain now more constant over the past 6 days.  Describes the pain as an 8/10 bandlike pain across the mid abdomen occasionally radiating into the  chest although not currently.  States nausea with decreased appetite but denies any diarrhea or constipation.  No dysuria or hematuria.  Patient has been seeing his primary care doctor for the same and is having a work-up performed, neck step is GI medicine follow-up.  Given the patient's increased pain however in more constant pain over the past 6 days we will check labs, obtain a CT scan and continue to closely monitor.  We will treat pain and nausea while awaiting results.  Patient's basic labs are largely within normal limits with a very slight leukocytosis.  Troponin is reassuringly negative.  I have added on a hepatic function panel as well as a lipase.  Patient CT is consistent with pancreatitis with small pseudocyst.  Lipase is moderately elevated.  Patient has a history of alcohol use 3-4 beers per day per patient.  Offered to admit the patient in fact recommended and had already paged the hospitalist but the patient states with his schedule this week he cannot be admitted to the hospital and wants to go home.  I discussed with the patient a clear liquid diet for the next 4 days followed by light diet and a full 2 weeks before he eats or drinks anything fatty and of course alcohol cessation.  Patient agreeable to plan of care and will follow up with GI medicine.  Ricky Herman was evaluated in Emergency Department on 07/25/2021 for the symptoms described in the history of present illness. He was evaluated in the context of the global COVID-19 pandemic, which necessitated consideration that the patient might be at risk for infection with the SARS-CoV-2 virus that causes COVID-19. Institutional protocols and algorithms that pertain to the evaluation of patients at risk for COVID-19 are in a state of rapid change based on information released by regulatory bodies including the CDC and federal and state organizations. These policies and algorithms were followed during the patient's care in the  ED.  ____________________________________________   FINAL CLINICAL IMPRESSION(S) / ED DIAGNOSES  Abdominal pain Pancreatitis   Harvest Dark, MD 07/25/21 1441

## 2021-07-25 NOTE — ED Triage Notes (Signed)
Pt comes into the ED via [facility] clinic c/o epigastric pain and chest pain that has been intermittent for months.  Pt states he has completed an echo, HIDA scan, [place] etc with no success of finding anything.  Pt currently has even and unlabored respirations.  Pt states he was in the process of getting an EGD scheduled, but it hasnt been scheduled yet.

## 2021-10-02 ENCOUNTER — Other Ambulatory Visit: Payer: Self-pay | Admitting: Gastroenterology

## 2021-10-02 DIAGNOSIS — R933 Abnormal findings on diagnostic imaging of other parts of digestive tract: Secondary | ICD-10-CM

## 2021-10-02 DIAGNOSIS — K861 Other chronic pancreatitis: Secondary | ICD-10-CM

## 2021-10-11 ENCOUNTER — Ambulatory Visit: Payer: BC Managed Care – PPO

## 2021-11-11 ENCOUNTER — Ambulatory Visit: Payer: BC Managed Care – PPO

## 2021-12-11 ENCOUNTER — Encounter: Payer: Self-pay | Admitting: Gastroenterology

## 2021-12-12 ENCOUNTER — Ambulatory Visit
Admission: RE | Admit: 2021-12-12 | Payer: BC Managed Care – PPO | Source: Ambulatory Visit | Admitting: Gastroenterology

## 2021-12-12 ENCOUNTER — Encounter: Admission: RE | Payer: Self-pay | Source: Ambulatory Visit

## 2021-12-12 HISTORY — DX: Essential (primary) hypertension: I10

## 2021-12-12 HISTORY — DX: Acute pancreatitis without necrosis or infection, unspecified: K85.90

## 2021-12-12 HISTORY — DX: Gastro-esophageal reflux disease without esophagitis: K21.9

## 2021-12-12 SURGERY — COLONOSCOPY WITH PROPOFOL
Anesthesia: General

## 2022-10-26 IMAGING — CR DG CHEST 2V
2 series · 2 of 2 positions shown · non-contrast
Comparison: None.

CLINICAL DATA: chest pain

EXAM:
CHEST - 2 VIEW

[chest pa]
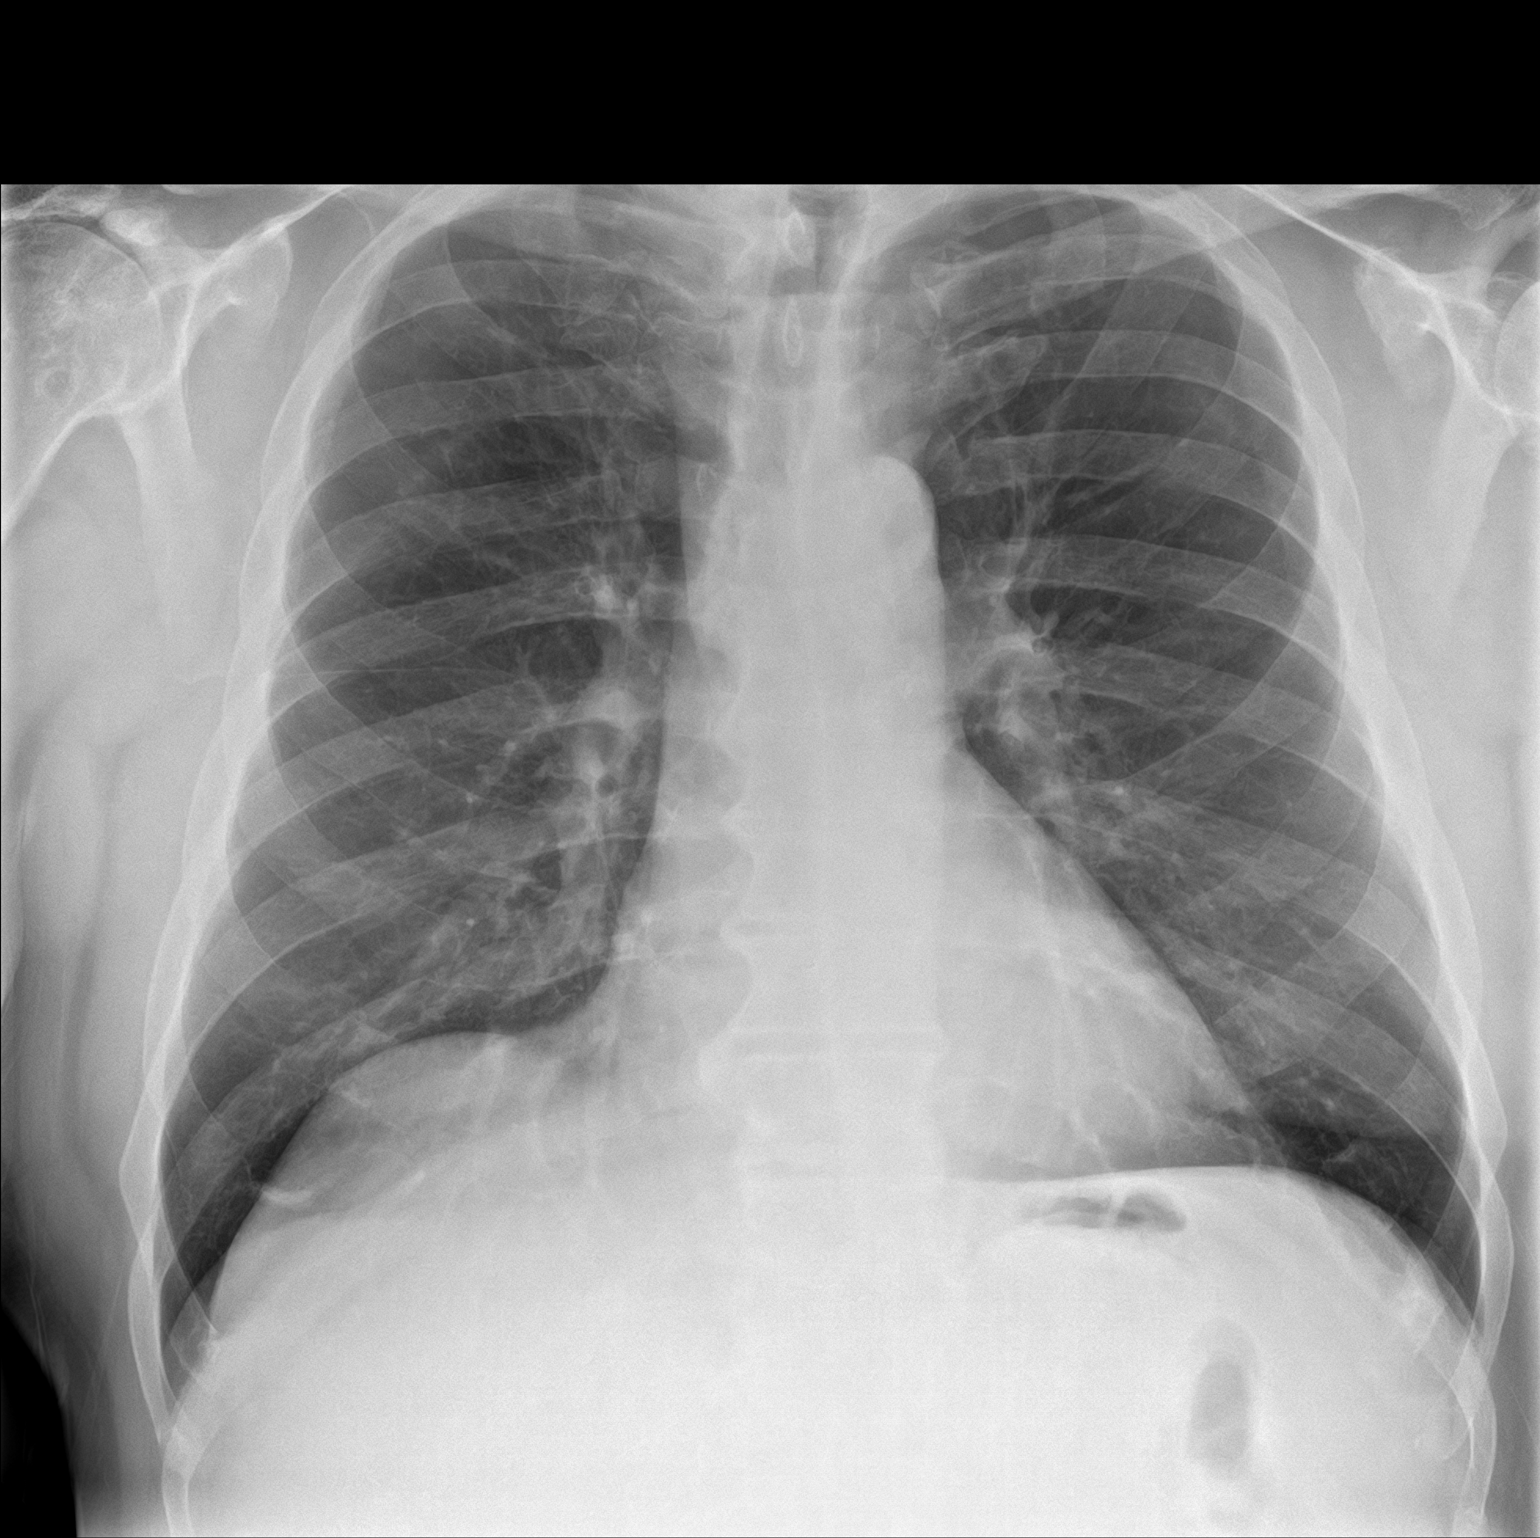

[chest lat]
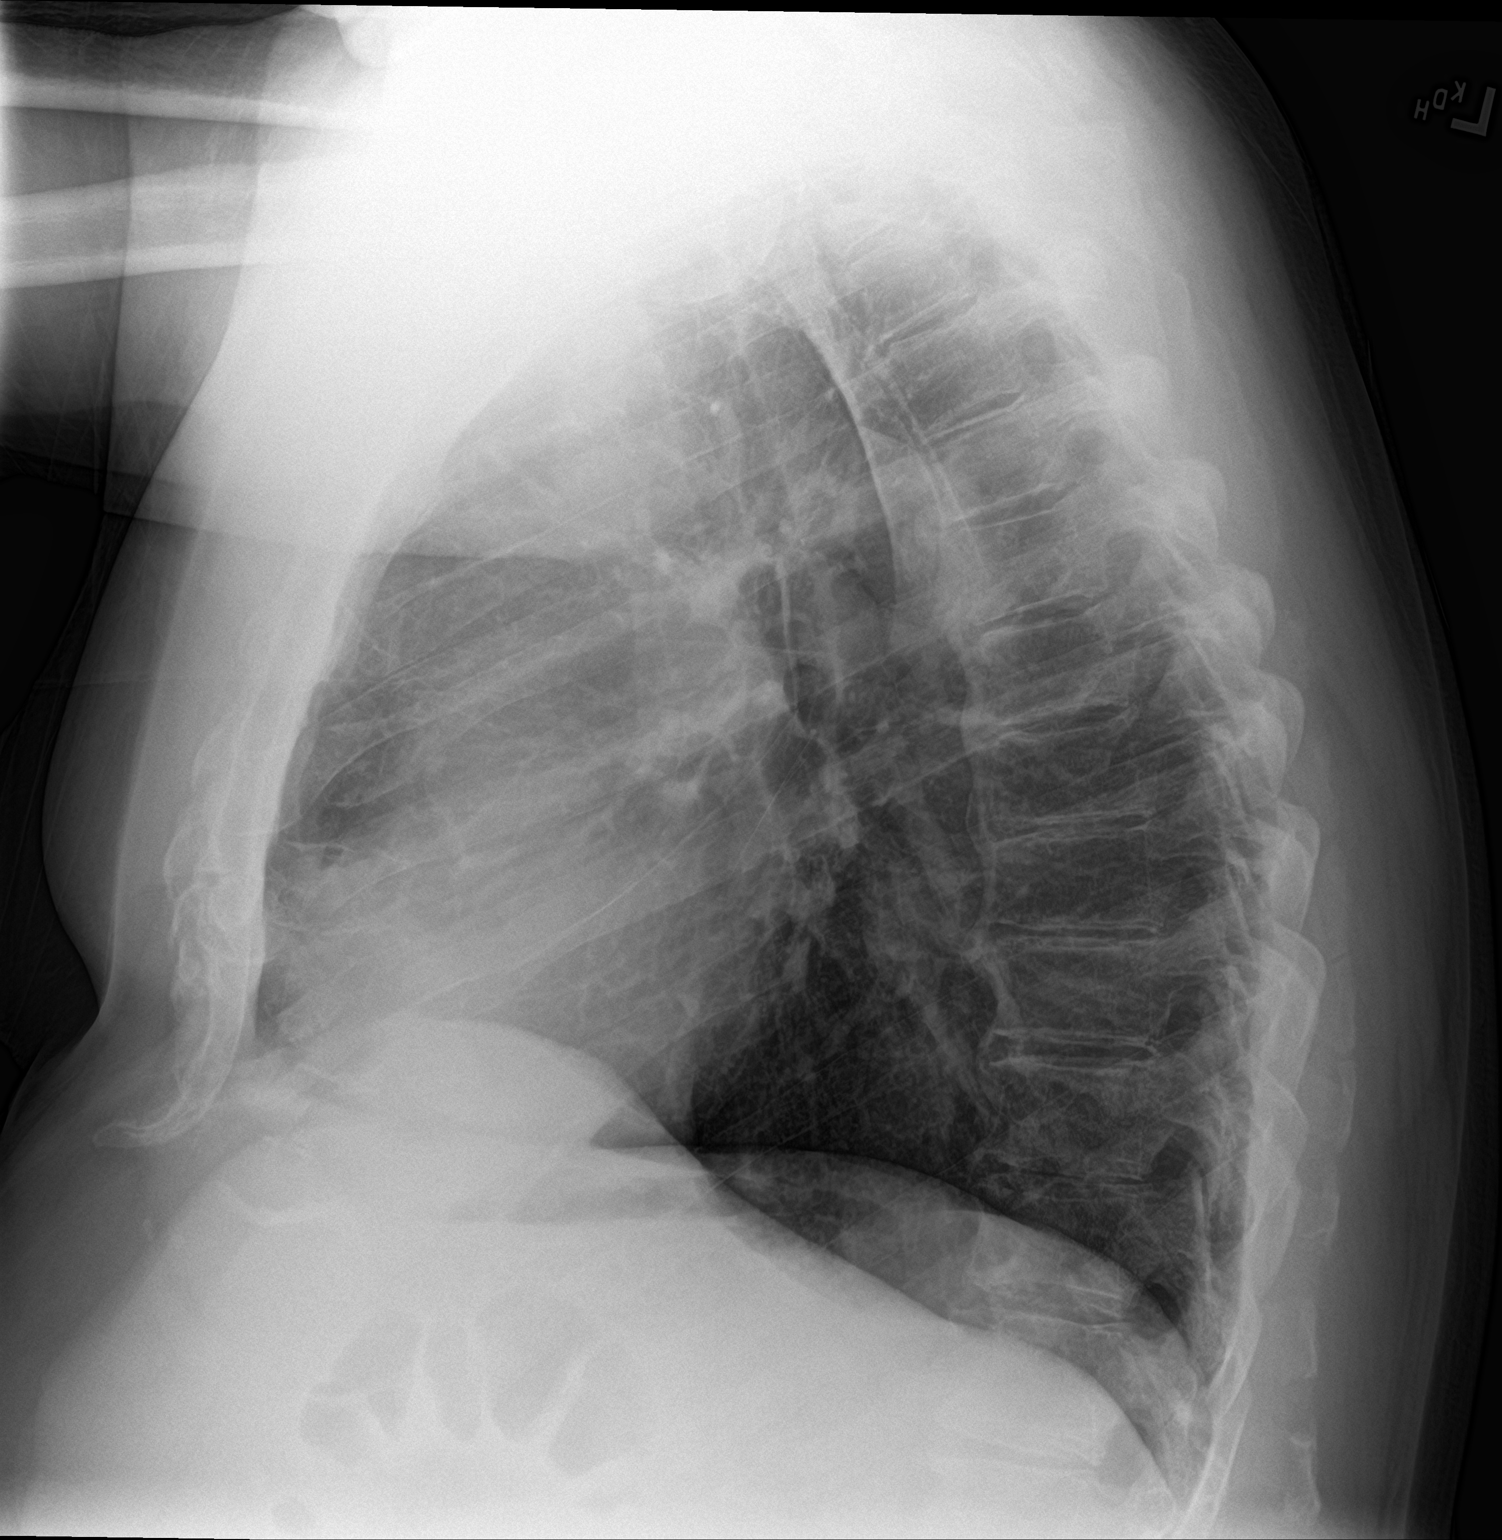

[2 of 2 positions shown; findings below may reference images not displayed]

FINDINGS: The heart size and mediastinal contours are within normal limits.
Both lungs are clear. No visible pleural effusions or pneumothorax.
No acute osseous abnormality. Mild degenerative change of the
thoracic spine.
IMPRESSION: No evidence of acute cardiopulmonary disease.
